# Patient Record
Sex: Female | Born: 1970
Health system: Southern US, Community
[De-identification: ages and names within clinical notes are randomized; demographics above are authoritative.]

## PROBLEM LIST (undated history)

## (undated) DIAGNOSIS — I10 Essential (primary) hypertension: Secondary | ICD-10-CM

## (undated) DIAGNOSIS — F419 Anxiety disorder, unspecified: Secondary | ICD-10-CM

## (undated) DIAGNOSIS — E079 Disorder of thyroid, unspecified: Secondary | ICD-10-CM

## (undated) DIAGNOSIS — B009 Herpesviral infection, unspecified: Secondary | ICD-10-CM

## (undated) HISTORY — DX: Anxiety disorder, unspecified: F41.9

## (undated) HISTORY — DX: Essential (primary) hypertension: I10

## (undated) HISTORY — DX: Disorder of thyroid, unspecified: E07.9

## (undated) HISTORY — DX: Herpesviral infection, unspecified: B00.9

---

## 1994-12-29 HISTORY — PX: TUBAL LIGATION: SHX77

## 1999-11-14 ENCOUNTER — Other Ambulatory Visit: Admission: RE | Admit: 1999-11-14 | Discharge: 1999-11-14 | Payer: Self-pay | Admitting: Obstetrics and Gynecology

## 2000-12-10 ENCOUNTER — Other Ambulatory Visit: Admission: RE | Admit: 2000-12-10 | Discharge: 2000-12-10 | Payer: Self-pay | Admitting: Obstetrics and Gynecology

## 2000-12-24 ENCOUNTER — Encounter: Payer: Self-pay | Admitting: Emergency Medicine

## 2000-12-24 ENCOUNTER — Observation Stay (HOSPITAL_COMMUNITY): Admission: EM | Admit: 2000-12-24 | Discharge: 2000-12-25 | Payer: Self-pay | Admitting: Emergency Medicine

## 2002-04-21 ENCOUNTER — Other Ambulatory Visit: Admission: RE | Admit: 2002-04-21 | Discharge: 2002-04-21 | Payer: Self-pay | Admitting: Obstetrics and Gynecology

## 2003-05-22 ENCOUNTER — Other Ambulatory Visit: Admission: RE | Admit: 2003-05-22 | Discharge: 2003-05-22 | Payer: Self-pay | Admitting: Obstetrics and Gynecology

## 2004-08-26 ENCOUNTER — Other Ambulatory Visit: Admission: RE | Admit: 2004-08-26 | Discharge: 2004-08-26 | Payer: Self-pay | Admitting: Obstetrics and Gynecology

## 2004-09-28 HISTORY — PX: BARTHOLIN GLAND CYST EXCISION: SHX565

## 2004-11-15 ENCOUNTER — Ambulatory Visit (HOSPITAL_COMMUNITY): Admission: RE | Admit: 2004-11-15 | Discharge: 2004-11-15 | Payer: Self-pay | Admitting: Obstetrics and Gynecology

## 2004-11-15 ENCOUNTER — Encounter (INDEPENDENT_AMBULATORY_CARE_PROVIDER_SITE_OTHER): Payer: Self-pay | Admitting: *Deleted

## 2004-11-19 ENCOUNTER — Observation Stay (HOSPITAL_COMMUNITY): Admission: RE | Admit: 2004-11-19 | Discharge: 2004-11-20 | Payer: Self-pay | Admitting: Obstetrics and Gynecology

## 2006-03-27 ENCOUNTER — Emergency Department (HOSPITAL_COMMUNITY): Admission: EM | Admit: 2006-03-27 | Discharge: 2006-03-27 | Payer: Self-pay | Admitting: Emergency Medicine

## 2006-06-18 ENCOUNTER — Other Ambulatory Visit: Admission: RE | Admit: 2006-06-18 | Discharge: 2006-06-18 | Payer: Self-pay | Admitting: Obstetrics & Gynecology

## 2007-06-07 ENCOUNTER — Emergency Department (HOSPITAL_COMMUNITY): Admission: EM | Admit: 2007-06-07 | Discharge: 2007-06-08 | Payer: Self-pay | Admitting: Emergency Medicine

## 2007-06-25 ENCOUNTER — Other Ambulatory Visit: Admission: RE | Admit: 2007-06-25 | Discharge: 2007-06-25 | Payer: Self-pay | Admitting: Obstetrics & Gynecology

## 2008-11-02 ENCOUNTER — Other Ambulatory Visit: Admission: RE | Admit: 2008-11-02 | Discharge: 2008-11-02 | Payer: Self-pay | Admitting: Obstetrics and Gynecology

## 2011-05-16 NOTE — Op Note (Signed)
NAMECARMINE, YOUNGBERG               ACCOUNT NO.:  [id number]   MEDICAL RECORD NO.:  [id number]          PATIENT TYPE:  AMB   LOCATION:  SDC                           FACILITY:  [facility]   PHYSICIAN:  Dineen Kid. Rana Snare, M.D.    DATE OF BIRTH:  11/23/1971   DATE OF PROCEDURE:  11/15/2004  DATE OF DISCHARGE:                                 OPERATIVE REPORT   PREOPERATIVE DIAGNOSES:  Recurrent right Bartholin's cyst.   POSTOPERATIVE DIAGNOSES:  Recurrent right Bartholin's cyst.   PROCEDURE:  Excision of right Bartholin's duct cyst.   SURGEON:  Dineen Kid. Rana Snare, M.D.   INDICATIONS FOR PROCEDURE:  Ms. Donna Ray is a 40 year old, G3, P2, A1 whose  had problems with recurrent right Bartholin's duct cyst. She continues to  have a problem with this and has been scheduled several times in the past to  have this excised but never truly has found the time to get this done.  She  has had it marsupialized in the past with recurrences of this and she  presents today for definitive surgical excision of the Bartholin's duct  cyst.  The risks and benefits were discussed at length which include but not  limited to risk of infection, bleeding, damage to the vulva, bleeding  problems, risks associated with bleeding, nerve damage or chronic pain. She  gives her informed consent.   DESCRIPTION OF PROCEDURE:  After adequate analgesic, the patient placed in  the dorsal lithotomy position, she was sterilely prepped and draped, bladder  was sterilely drained. An elliptical incision was made just outside the  hymenal ring across the right Bartholin's duct cyst which was measured and  approximately 3-4 cm in size.  Approximately a 2.5 cm incision was made  which was taken down sharply to the Bartholin's duct cyst wall. The cyst  wall was grasped with the Allis clamp and was dissected free from the wall  using Metzenbaum scissors. The base was incised with Bovie cautery and the  duct removed. There was some leakage at the  time which was clear mucusy  fluid. In the base of the gland, the blood supply was grasped with  hemostats, suture ligated with #0 Monocryl suture in a figure-of-eight  fashion until the hemostasis was achieved.  At the base of the gland, the  dead space was closed with interrupted sutures of #0 Monocryl suture  achieving good hemostasis as well. The skin was then closed with a running 3-  0 Monocryl in a running fashion with good approximation and good hemostasis  achieved. The incision was then injected with Marcaine 0.25%, 10 mL used.  The patient went to recovery in stable condition. The patient received 1 g  of Rocephin preoperatively.  Estimated blood loss 100 mL.   DISPOSITION:  The patient will be discharged home with followup in the  office in 2-3 weeks. Sent home with a prescription for Vicodin #30, Keflex  50 mg t.i.d. for seven days and refilled her prescription for Prozac 20 mg  q.d.  Told to continue with ice for 24 hours, call for increased pain, fever  or bleeding.     DCL/MEDQ  D:  11/15/2004  T:  11/15/2004  Job:  [id number]

## 2011-05-16 NOTE — Op Note (Signed)
NAMEAREONA, Donna Ray               ACCOUNT NO.:  [id number]   MEDICAL RECORD NO.:  [id number]          PATIENT TYPE:  INP   LOCATION:  9303                          FACILITY:  [facility]   PHYSICIAN:  Michelle L. Grewal, M.D.DATE OF BIRTH:  06/30/1971   DATE OF PROCEDURE:  11/19/2004  DATE OF DISCHARGE:                                 OPERATIVE REPORT   PREOPERATIVE DIAGNOSIS:  Right labia hematoma.   POSTOPERATIVE DIAGNOSIS:  Right labia hematoma.   PROCEDURE:  Evaluation of right labial hematoma.   SURGEON:  Dr. Vincente Poli   ANESTHESIA:  MAC.   ESTIMATED BLOOD LOSS:  600 mL.   DESCRIPTION OF PROCEDURE:  The patient was taken to the operating room.  She  was given sedation, placed in the lithotomy position.  Exam revealed an 8 cm  right labial hematoma.  She was prepped and draped in the usual sterile  fashion.  Local was infiltrated just over the hematoma, and an approximately  3 cm incision was made over the hematoma just lateral to a previous surgical  incision.  We immediately noticed bright red arterial bleeding which was  pulsatile.  We then had to extend the incision slightly inferiorly, and I  was able to identify the bleeding was coming from the inferior portion of  the surgical bed.  Using a series of interrupteds that had to be placed  pretty deeply using 0 Vicryl suture, we performed probably 6-7 figure-of-  eights prior to getting hemostasis.  There was still some oozing from the  superior part of the incision down from the right labia, and packing was  placed in this area for hemostasis.  At the end of the procedure, there was  no active bleeding noted, and the hematoma was completely decompressed.  I  did leave the incision open so that they could close by secondary intent.  An ice pack was placed in the perineum.  All sponge, lap, and instrument  counts were correct x 2.  The patient was taken to the recovery room in  stable condition.     [place]   MLG/MEDQ  D:   11/19/2004  T:  11/19/2004  Job:  [id number]

## 2011-05-16 NOTE — Discharge Summary (Signed)
[facility]  Patient:    Donna Ray, Donna Ray                      MRN: [id number] Adm. Date:  [id number] Disc. Date: [id number] Attending:  Selina Cooley Dictator:   Selina Cooley, M.D.                           Discharge Summary  DISCHARGE DIAGNOSES: 1. Nausea, vomiting, diarrhea, probable gastroenteritis. 2. Dehydration. 3. Mild transaminitis with SGOT 49, SGPT 45. 4. Bacterial vaginosis. 5. Status post cesarean section x 2 in 1993 and 1996.  DISCHARGE MEDICATIONS: MetroGel as previously prescribed.  CONDITION UPON DISCHARGE: The patient is afebrile with stable vital signs, alert and oriented, tolerating liquids with no nausea or vomiting, complete resolution of her abdominal pain and no further recurrence of diarrhea.  REASON FOR ADMISSION:  The patient is a 40 year old African-American female, otherwise healthy, who describes acute onset of nausea and vomiting the morning prior to admission associated with diarrhea.  Nonbloody emesis or stool was noted.  She was seen by her primary care physician and administered Phenergan 25 IM in the office; however, despite the antiemetics, she continued to have multiple episodes of emesis associated with periumbilical pain.  HOSPITAL COURSE:  #1 - NAUSEA, VOMITING, DIARRHEA:  Probable viral gastroenteritis.  The patient was admitted for IV fluids and was administered IV antiemetics and pain medicine.  She did not require any further medications after her initial admission doses.  She described marked resolution of all of her admitting symptoms.  CMET was obtained with normal chemistries; however there were slightly elevated transaminase in the 40s approximately two times normal.  Her white count was slightly elevated at 12.5 after hydration.  The following morning, it was noted to be 10.8.  Given the marked improvement in her symptoms, no further workup was deemed necessary, and she will be discharged to  home as she is tolerating a p.o. diet.  #2 - BACTERIAL VAGINOSIS:  Diagnosed prior to admission.  The patient will continue her medication for treatment of this.  The patient did not have any history or evidence suggestive of PID, and suspicion was further diminished since her symptoms resolved so quickly after IV fluids.  The patient will be discharged to home and follow up with Dr. Kevan Ny as needed. DD:  12/25/00 TD:  12/25/00 Job: [id number]

## 2011-10-16 LAB — URINE MICROSCOPIC-ADD ON

## 2011-10-16 LAB — BASIC METABOLIC PANEL
BUN: 4 — ABNORMAL LOW
CO2: 22
Calcium: 9.7
Chloride: 107
Creatinine, Ser: 0.81
GFR calc Af Amer: >60
GFR calc non Af Amer: >60
Glucose, Bld: 138 — ABNORMAL HIGH
Potassium: 3.2 — ABNORMAL LOW
Sodium: 142

## 2011-10-16 LAB — URINALYSIS, ROUTINE W REFLEX MICROSCOPIC
Bilirubin Urine: NEGATIVE
Glucose, UA: NEGATIVE
Hgb urine dipstick: NEGATIVE
Ketones, ur: >80 — AB
Leukocytes, UA: NEGATIVE
Nitrite: NEGATIVE
Protein, ur: 30 — AB
Specific Gravity, Urine: 1.021
Urobilinogen, UA: 0.2
pH: 7

## 2011-10-16 LAB — PREGNANCY, URINE: Preg Test, Ur: NEGATIVE

## 2013-05-27 ENCOUNTER — Encounter: Payer: Self-pay | Admitting: *Deleted

## 2013-05-30 ENCOUNTER — Encounter: Payer: Self-pay | Admitting: *Deleted

## 2013-05-31 ENCOUNTER — Ambulatory Visit: Payer: Self-pay | Admitting: Obstetrics & Gynecology

## 2013-06-06 ENCOUNTER — Encounter: Payer: Self-pay | Admitting: *Deleted

## 2013-06-09 ENCOUNTER — Ambulatory Visit: Payer: Self-pay | Admitting: Obstetrics & Gynecology

## 2013-07-22 ENCOUNTER — Telehealth: Payer: Self-pay | Admitting: Obstetrics & Gynecology

## 2013-07-22 MED ORDER — ALPRAZOLAM 0.25 MG PO TABS: 0.2500 mg | ORAL_TABLET | Freq: Every evening | ORAL | Status: DC | PRN

## 2013-07-22 NOTE — Telephone Encounter (Signed)
Refill request for ALPRAZOLAM Last filled by MD on - 05/10/12, #30 X 5 rf (take QHS) Last AEX - 05/10/12 Next AEX - 07/28/13 Please advise if refill OK.

## 2013-07-22 NOTE — Telephone Encounter (Signed)
Chart on your desk.

## 2013-07-22 NOTE — Telephone Encounter (Signed)
Patient needs anxiety rx (patient forgets name) refill sent to [facility] on [address]. AEX appointment moved up with Dr. Hyacinth Meeker from mid-August to 07/28/13 with Dr. Gloris Ham.

## 2013-07-28 ENCOUNTER — Encounter: Payer: Self-pay | Admitting: Obstetrics & Gynecology

## 2013-07-28 ENCOUNTER — Ambulatory Visit (INDEPENDENT_AMBULATORY_CARE_PROVIDER_SITE_OTHER): Payer: Managed Care, Other (non HMO) | Admitting: Obstetrics & Gynecology

## 2013-07-28 VITALS — BP 140/90 | HR 68 | Resp 16 | Ht 65.75 in | Wt 132.6 lb

## 2013-07-28 DIAGNOSIS — Z0000 Encounter for general adult medical examination without abnormal findings: Secondary | ICD-10-CM

## 2013-07-28 DIAGNOSIS — Z01419 Encounter for gynecological examination (general) (routine) without abnormal findings: Secondary | ICD-10-CM

## 2013-07-28 LAB — POCT URINALYSIS DIPSTICK
Ketones, UA: NEGATIVE
Protein, UA: NEGATIVE

## 2013-07-28 NOTE — Progress Notes (Signed)
42 y.o. G3P2 Legally SeparatedAfrican AmericanF here for annual exam.  Patient is doing well.  54 year old son had ruptured appendix and ended up in hospital for 21 days.  He was able to graduated on time.  Very scary for patient.   He's going to [employer].  He's going to be a Runner, broadcasting/film/video.  Cycles regular, every 26-28 days.  No change.    Patient's last menstrual period was 07/24/2013.          Sexually active: yes  The current method of family planning is tubal ligation.    Exercising: yes  walking and toning Smoker:  no  Health Maintenance: Pap:  05/10/12 WNL/negative HR HPV History of abnormal Pap:  no MMG:  2012 normal Colonoscopy:  none BMD:   none TDaP:  05/10/12 Screening Labs: 1/12, Urine today: PH-8, RBC-1+   reports that she has never smoked. She has never used smokeless tobacco. She reports that she drinks about 0.5 ounces of alcohol per week. She reports that she does not use illicit drugs.  Past Medical History  Diagnosis Date  . Anxiety   . HSV-2 infection   . Hypertension     Past Surgical History  Procedure Laterality Date  . Bartholin gland cyst excision      with hematoma post op  . Cesarean section      x2  . Tubal ligation      Current Outpatient Prescriptions  Medication Sig Dispense Refill  . ALPRAZolam (XANAX) 0.25 MG tablet Take 1 tablet (0.25 mg total) by mouth at bedtime as needed for sleep.  30 tablet  0  . AMLODIPINE BESYLATE PO Take 2.5 mg by mouth daily.      Marland Kitchen ibuprofen (ADVIL,MOTRIN) 200 MG tablet Take 200 mg by mouth every 6 (six) hours as needed for pain.      . Multiple Vitamin (MULTIVITAMIN) capsule Take 1 capsule by mouth daily.      Marland Kitchen zolpidem (AMBIEN) 10 MG tablet Take 10 mg by mouth at bedtime as needed for sleep.       No current facility-administered medications for this visit.    Family History  Problem Relation Age of Onset  . Hyperlipidemia Mother   . Hypertension Mother   . Diabetes Father     ROS:  Pertinent items are noted in  HPI.  Otherwise, a comprehensive ROS was negative.  Exam:   BP 140/90  Pulse 68  Resp 16  Ht 5' 5.75" (1.67 m)  Wt 132 lb 9.6 oz (60.147 kg)  BMI 21.57 kg/m2  LMP 07/24/2013  Weight change: +2lb  Height: 5' 5.75" (167 cm)  Ht Readings from Last 3 Encounters:  07/28/13 5' 5.75" (1.67 m)    General appearance: alert, cooperative and appears stated age Head: Normocephalic, without obvious abnormality, atraumatic Neck: no adenopathy, supple, symmetrical, trachea midline and thyroid normal to inspection and palpation Lungs: clear to auscultation bilaterally Breasts: normal appearance, no masses or tenderness Heart: regular rate and rhythm Abdomen: soft, non-tender; bowel sounds normal; no masses,  no organomegaly Extremities: extremities normal, atraumatic, no cyanosis or edema Skin: Skin color, texture, turgor normal. No rashes or lesions Lymph nodes: Cervical, supraclavicular, and axillary nodes normal. No abnormal inguinal nodes palpated Neurologic: Grossly normal   Pelvic: External genitalia:  no lesions              Urethra:  normal appearing urethra with no masses, tenderness or lesions  Bartholins and Skenes: normal                 Vagina: normal appearing vagina with normal color and discharge, no lesions              Cervix: no lesions              Pap taken: no Bimanual Exam:  Uterus:  normal size, contour, position, consistency, mobility, non-tender              Adnexa: normal adnexa and no mass, fullness, tenderness               Rectovaginal: Confirms               Anus:  normal sphincter tone, no lesions  A:  Well Woman with normal exam Hypertension Anxiety H/O BTL  P:   Mammogram over due.  Appt scheduled for pt. pap smear with neg HR HPV last year.  No Pap today. return annually or prn  An After Visit Summary was printed and given to the patient.

## 2013-07-28 NOTE — Patient Instructions (Signed)

## 2013-08-05 ENCOUNTER — Telehealth: Payer: Self-pay | Admitting: Obstetrics & Gynecology

## 2013-08-05 MED ORDER — ZOLPIDEM TARTRATE 10 MG PO TABS: 10.0000 mg | ORAL_TABLET | Freq: Every evening | ORAL | Status: DC | PRN

## 2013-08-05 NOTE — Addendum Note (Signed)
Addended by: Jerene Bears on: 08/05/2013 04:45 PM   Modules accepted: Orders

## 2013-08-05 NOTE — Telephone Encounter (Signed)
Needs refill for zolpidem 10 mg called in to [facility].  She is out of medication.

## 2013-08-05 NOTE — Telephone Encounter (Signed)
Please advise patient is needing rf of Ambien, pt was last seen for AEX 07/28/13 no rx was given.

## 2013-08-05 NOTE — Telephone Encounter (Signed)
#  30/5 refills was sent By Dr.Miller

## 2013-08-17 ENCOUNTER — Other Ambulatory Visit: Payer: Self-pay | Admitting: Obstetrics & Gynecology

## 2013-08-17 NOTE — Telephone Encounter (Signed)
Please advise Last Refilled given 07/22/13 #30/0rfs Last AEX 07/28/13

## 2013-08-19 ENCOUNTER — Ambulatory Visit: Payer: Self-pay | Admitting: Obstetrics & Gynecology

## 2013-08-22 ENCOUNTER — Telehealth: Payer: Self-pay | Admitting: Obstetrics & Gynecology

## 2013-08-22 NOTE — Telephone Encounter (Signed)
Patient needs refill on Gen. Xanax . Completely out of it as of today.

## 2013-08-22 NOTE — Telephone Encounter (Signed)
Rx was sent 08/17/13 #30/0rfs was sent to pharmacy pt notified.

## 2013-09-20 ENCOUNTER — Telehealth: Payer: Self-pay | Admitting: Obstetrics & Gynecology

## 2013-09-20 NOTE — Telephone Encounter (Signed)
Spoke with pt about symptoms. Pt had nausea and vomiting with some diarrhea last week and saw her PCP. Pt thinks her rectum is irritated from all the wiping she's had to do. Only had a tiny amount of bleeding on the toilet tissue. Denies any trauma to area. Offered OV to pt, but she declined for now. Advised pt to try Aveeno oatmeal sitz bath in shallow warm water. Pt agreeable and will call back if necessary for OV or if symptoms do not resolve. Any further advice?

## 2013-09-20 NOTE — Telephone Encounter (Signed)
Patient is having discomfort around rectum area. Some bleeding when she wipes. Stool is loose and sticky. Has been going on since Saturday.

## 2013-09-22 ENCOUNTER — Other Ambulatory Visit: Payer: Self-pay | Admitting: Obstetrics & Gynecology

## 2013-09-22 NOTE — Telephone Encounter (Signed)
08/17/13 #30/0rf;s was sent  AEX was 07/28/13  Please advise.

## 2013-09-23 NOTE — Telephone Encounter (Signed)
Pt takes qhs.  RX for six months done.

## 2013-09-27 NOTE — Telephone Encounter (Signed)
Patient reports she is still having trouble with her rectum and wants an appointment please?

## 2013-09-27 NOTE — Telephone Encounter (Signed)
Spoke with patient. She was unable to make the 1045 OV offered today with Dr. Hyacinth Meeker.  She requests to be seen by Verner Chol CNM.  Appointment scheduled for tomorrow per patient request.

## 2013-09-28 ENCOUNTER — Encounter: Payer: Self-pay | Admitting: Certified Nurse Midwife

## 2013-09-28 ENCOUNTER — Ambulatory Visit (INDEPENDENT_AMBULATORY_CARE_PROVIDER_SITE_OTHER): Payer: Managed Care, Other (non HMO) | Admitting: Certified Nurse Midwife

## 2013-09-28 VITALS — BP 90/60 | HR 78 | Resp 16 | Ht 65.75 in | Wt 117.0 lb

## 2013-09-28 DIAGNOSIS — K5289 Other specified noninfective gastroenteritis and colitis: Secondary | ICD-10-CM

## 2013-09-28 NOTE — Progress Notes (Signed)
42 y.o. Divorced [place] American female G3P2 here with complaint of loose stools and then formed stools for the past two weeks. Denies bloody stools or dark tarry stools. Patient saw PCP regarding this two weeks ago with labs. All normal except for anemia, has not started in OTC iron supplement. per patient( will send copy of report.) Patient eating sporadically. Drinking water through out the day.Patient denies fever, chills, headache or vomiting. No exposure to flu like illness that she is aware of. Has not traveled out of the country.Patient experiencing new company take over at work and job security is at risk. 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 Patient was started on Citalopram approximately 1 1/2 weeks per PCP to help with anxiety,she feels is helping some. Periods normal, no issues. No STD concerns.  O: Healthy WD,thin female Affect: Anxious, Orientation x 3 Skin:Warm and dry Abdomen:Soft, non tender, no masses palpated, bowel sounds noted in all 4 quadrants, normal, no point of any pain noted Pelvic exam:EXTERNAL GENITALIA: normal appearing vulva with no masses, tenderness or lesions VAGINA: no abnormal discharge or lesions CERVIX: no lesions or cervical motion tenderness and non tender UTERUS: anteverted and normal,non tender ADNEXA: no masses palpable and nontender RECTUM: no masses or abnormalities, no blood noted  A:Gastrenteritis vs IBS related to anxiety level with work and family stress. Normal abdominal exam Normal pelvic exam   P: Discussed findings of normal exam. Discussed stress and anxiety effect on GI system. Discussed that normal diet with fiber will help with regulating the bowel. Instructed to start on Adult refrigerated Probiotics two per day for one week then daily. Given information on normal diet and fiber content handout. Warning signs of IBS and loose stools given. If bloody stools occur will need GI referral.  Stressed asking for help with mother from other siblings until job stress decreases. Patient will try. Given note for absence of work. Patient to fax labs from PCP. Encouraged to start OTC time release iron daily as directed. Recheck 1 week if has not resolved or needs referral.   RV as above or prn

## 2013-09-29 NOTE — Progress Notes (Signed)
Note reviewed, agree with plan.  Abbie Jablon, MD  

## 2013-10-05 ENCOUNTER — Encounter (HOSPITAL_COMMUNITY): Payer: Self-pay | Admitting: Emergency Medicine

## 2013-10-05 ENCOUNTER — Emergency Department (HOSPITAL_COMMUNITY)
Admission: EM | Admit: 2013-10-05 | Discharge: 2013-10-05 | Disposition: A | Discharge status: Home / Self Care | Payer: BC Managed Care – PPO | Attending: Emergency Medicine | Admitting: Emergency Medicine

## 2013-10-05 DIAGNOSIS — I10 Essential (primary) hypertension: Secondary | ICD-10-CM | POA: Insufficient documentation

## 2013-10-05 DIAGNOSIS — Z79899 Other long term (current) drug therapy: Secondary | ICD-10-CM | POA: Insufficient documentation

## 2013-10-05 DIAGNOSIS — Z8619 Personal history of other infectious and parasitic diseases: Secondary | ICD-10-CM | POA: Insufficient documentation

## 2013-10-05 DIAGNOSIS — R1033 Periumbilical pain: Secondary | ICD-10-CM | POA: Insufficient documentation

## 2013-10-05 DIAGNOSIS — R209 Unspecified disturbances of skin sensation: Secondary | ICD-10-CM | POA: Insufficient documentation

## 2013-10-05 DIAGNOSIS — R112 Nausea with vomiting, unspecified: Secondary | ICD-10-CM | POA: Insufficient documentation

## 2013-10-05 DIAGNOSIS — F411 Generalized anxiety disorder: Secondary | ICD-10-CM | POA: Insufficient documentation

## 2013-10-05 LAB — URINALYSIS, ROUTINE W REFLEX MICROSCOPIC
Bilirubin Urine: NEGATIVE
Hgb urine dipstick: NEGATIVE
Ketones, ur: >80 mg/dL — AB
Protein, ur: NEGATIVE mg/dL
Specific Gravity, Urine: 1.012 (ref 1.005–1.030)
Urobilinogen, UA: 0.2 mg/dL (ref 0.0–1.0)

## 2013-10-05 LAB — COMPREHENSIVE METABOLIC PANEL
ALT: 63 U/L — ABNORMAL HIGH (ref 0–35)
BUN: 8 mg/dL (ref 6–23)
CO2: 18 mEq/L — ABNORMAL LOW (ref 19–32)
Calcium: 10.2 mg/dL (ref 8.4–10.5)
GFR calc Af Amer: >90 mL/min (ref >90)
GFR calc non Af Amer: >90 mL/min (ref >90)
Glucose, Bld: 166 mg/dL — ABNORMAL HIGH (ref 70–99)
Sodium: 139 mEq/L (ref 135–145)
Total Bilirubin: 0.4 mg/dL (ref 0.3–1.2)
Total Protein: 7.1 g/dL (ref 6.0–8.3)

## 2013-10-05 LAB — CBC WITH DIFFERENTIAL/PLATELET
Eosinophils Relative: 0 % (ref 0–5)
HCT: 36.5 % (ref 36.0–46.0)
Hemoglobin: 12.1 g/dL (ref 12.0–15.0)
Lymphocytes Relative: 25 % (ref 12–46)
Lymphs Abs: 1.4 10*3/uL (ref 0.7–4.0)
MCV: 72.3 fL — ABNORMAL LOW (ref 78.0–100.0)
Monocytes Absolute: 0.5 10*3/uL (ref 0.1–1.0)
Neutro Abs: 3.7 10*3/uL (ref 1.7–7.7)
RBC: 5.05 MIL/uL (ref 3.87–5.11)
WBC: 5.7 10*3/uL (ref 4.0–10.5)

## 2013-10-05 LAB — POCT I-STAT, CHEM 8
BUN: 4 mg/dL — ABNORMAL LOW (ref 6–23)
Chloride: 112 mEq/L (ref 96–112)
Creatinine, Ser: 0.6 mg/dL (ref 0.50–1.10)
HCT: 42 % (ref 36.0–46.0)
Hemoglobin: 14.3 g/dL (ref 12.0–15.0)
Potassium: 3.9 mEq/L (ref 3.5–5.1)
Sodium: 143 mEq/L (ref 135–145)

## 2013-10-05 LAB — SALICYLATE LEVEL: Salicylate Lvl: <2 mg/dL — ABNORMAL LOW (ref 2.8–20.0)

## 2013-10-05 LAB — CG4 I-STAT (LACTIC ACID): Lactic Acid, Venous: 2.22 mmol/L — ABNORMAL HIGH (ref 0.5–2.2)

## 2013-10-05 LAB — LIPASE, BLOOD: Lipase: 25 U/L (ref 11–59)

## 2013-10-05 MED ORDER — METOCLOPRAMIDE HCL 5 MG/ML IJ SOLN
10.0000 mg | INTRAMUSCULAR | Status: AC
  Administered 2013-10-05: 10 mg via INTRAVENOUS
  Filled 2013-10-05: qty 2

## 2013-10-05 MED ORDER — SODIUM CHLORIDE 0.9 % IV BOLUS (SEPSIS): 1000.0000 mL | Freq: Once | INTRAVENOUS | Status: DC

## 2013-10-05 MED ORDER — ONDANSETRON HCL 4 MG/2ML IJ SOLN
4.0000 mg | INTRAMUSCULAR | Status: AC
  Administered 2013-10-05: 4 mg via INTRAVENOUS
  Filled 2013-10-05: qty 2

## 2013-10-05 MED ORDER — SODIUM CHLORIDE 0.9 % IV BOLUS (SEPSIS)
1000.0000 mL | Freq: Once | INTRAVENOUS | Status: AC
  Administered 2013-10-05: 1000 mL via INTRAVENOUS

## 2013-10-05 MED ORDER — OMEPRAZOLE 20 MG PO CPDR: 20.0000 mg | DELAYED_RELEASE_CAPSULE | Freq: Every day | ORAL | Status: DC

## 2013-10-05 MED ORDER — MORPHINE SULFATE 4 MG/ML IJ SOLN
4.0000 mg | Freq: Once | INTRAMUSCULAR | Status: AC
  Administered 2013-10-05: 4 mg via INTRAVENOUS
  Filled 2013-10-05: qty 1

## 2013-10-05 MED ORDER — ONDANSETRON HCL 4 MG PO TABS: 4.0000 mg | ORAL_TABLET | Freq: Four times a day (QID) | ORAL | Status: DC

## 2013-10-05 MED ORDER — PROMETHAZINE HCL 25 MG/ML IJ SOLN
12.5000 mg | Freq: Once | INTRAMUSCULAR | Status: AC
  Administered 2013-10-05: 12.5 mg via INTRAVENOUS
  Filled 2013-10-05: qty 1

## 2013-10-05 MED ORDER — HYDROMORPHONE HCL PF 1 MG/ML IJ SOLN
1.0000 mg | Freq: Once | INTRAMUSCULAR | Status: AC
  Administered 2013-10-05: 1 mg via INTRAVENOUS
  Filled 2013-10-05: qty 1

## 2013-10-05 NOTE — ED Notes (Signed)
IV team paged for IV start after several unsuccessful attempts.

## 2013-10-05 NOTE — ED Notes (Signed)
Patient states she started having N/V this morning about 630a.m.   Patient states her tongue and extremities are "tingly". Patient states abdominal pain. Patient denies any other symptoms.

## 2013-10-05 NOTE — ED Notes (Signed)
Pt is vomiting again, reports 2/10 abdominal pain.

## 2013-10-05 NOTE — ED Provider Notes (Signed)
CSN: [id number]     Arrival date & time 10/05/13  0820 History   First MD Initiated Contact with Patient 10/05/13 [phone]     Chief Complaint  Patient presents with  . Nausea  . Emesis   (Consider location/radiation/quality/duration/timing/severity/associated sxs/prior Treatment) HPI Comments: 42 y/o female with a hx of HTN presents for nausea and emesis with onset this morning. Patient endorses TNTC episodes of nonbloody, nonbilious emesis; she is actively dry heaving in exam room bed. Patient denies any modifying factors of her symptoms, but states they are associated with a periumbilical pain that she describes as a "fluttering" as well as a tingling sensation in her b/l hands. She endorses a bowel movement PTA that was normal and non-bloody. She denies associated fever, CP, SOB, urinary symptoms, melena, hematochezia, vaginal complaints, and weakness. Abdominal surgical hx significant for C-sections x2. Patient endorses a history of "bowel problems", but has only seen her OBGYN and PCP for these complaints. No hx of endoscopy or colonoscopy.  Patient is a 42 y.o. female presenting with vomiting. The history is provided by the patient. No language interpreter was used.  Emesis Associated symptoms: abdominal pain   Associated symptoms: no diarrhea     Past Medical History  Diagnosis Date  . Anxiety   . HSV-2 infection   . Hypertension    Past Surgical History  Procedure Laterality Date  . Bartholin gland cyst excision  10/05  . Cesarean section  1993, 1996    x2  . Tubal ligation  1996   Family History  Problem Relation Age of Onset  . Hyperlipidemia Mother   . Hypertension Mother   . Diabetes Father    History  Substance Use Topics  . Smoking status: Never Smoker   . Smokeless tobacco: Never Used  . Alcohol Use: 0.5 oz/week    1 drink(s) per week   OB History   Grav Para Term Preterm Abortions TAB SAB Ect Mult Living   3 2        2      Review of Systems   Constitutional: Negative for fever.  Respiratory: Negative for shortness of breath.   Cardiovascular: Negative for chest pain.  Gastrointestinal: Positive for nausea, vomiting and abdominal pain. Negative for diarrhea, constipation and blood in stool.  Genitourinary: Negative for dysuria, hematuria, vaginal bleeding and vaginal discharge.  Neurological: Negative for weakness and numbness.       +tingling in b/l hands  All other systems reviewed and are negative.    Allergies  Review of patient's allergies indicates no known allergies.  Home Medications   Current Outpatient Rx  Name  Route  Sig  Dispense  Refill  . ALPRAZolam (XANAX) 0.25 MG tablet   Oral   Take 0.25 mg by mouth at bedtime as needed for sleep.         Marland Kitchen amLODipine (NORVASC) 2.5 MG tablet   Oral   Take 2.5 mg by mouth daily.         . citalopram (CELEXA) 20 MG tablet      daily.         Marland Kitchen ibuprofen (ADVIL,MOTRIN) 200 MG tablet   Oral   Take 200 mg by mouth every 6 (six) hours as needed for pain.         . Multiple Vitamin (MULTIVITAMIN) capsule   Oral   Take 1 capsule by mouth daily.         Marland Kitchen omeprazole (PRILOSEC) 20 MG capsule   Oral  Take 1 capsule (20 mg total) by mouth daily.   30 capsule   0   . ondansetron (ZOFRAN) 4 MG tablet   Oral   Take 1 tablet (4 mg total) by mouth every 6 (six) hours.   20 tablet   0    BP 164/83  Pulse 92  Temp(Src) 97 F (36.1 C) (Oral)  Resp 18  SpO2 100%  LMP 09/17/2013  Physical Exam  Nursing note and vitals reviewed. Constitutional: She is oriented to person, place, and time. She appears well-developed and well-nourished. No distress.  Patient actively dry heaving in exam room bed. In no acute distress; appears uncomfortable.  HENT:  Head: Normocephalic and atraumatic.  Eyes: Conjunctivae and EOM are normal. No scleral icterus.  Neck: Normal range of motion. Neck supple.  Cardiovascular: Normal rate, regular rhythm, normal heart sounds  and intact distal pulses.   Pulmonary/Chest: Effort normal and breath sounds normal. No respiratory distress. She has no wheezes. She has no rales.  Abdominal: Soft. She exhibits no distension and no mass. There is tenderness (diffuse, nonfocal). There is no rebound and no guarding.  No peritoneal signs or guarding  Musculoskeletal: Normal range of motion.  Neurological: She is alert and oriented to person, place, and time.  Skin: Skin is warm and dry. No rash noted. She is not diaphoretic. No erythema. No pallor.  Psychiatric: She has a normal mood and affect. Her behavior is normal.    ED Course  Procedures (including critical care time) Labs Review Labs Reviewed  CBC WITH DIFFERENTIAL - Abnormal; Notable for the following:    MCV 72.3 (*)    MCH 24.0 (*)    All other components within normal limits  COMPREHENSIVE METABOLIC PANEL - Abnormal; Notable for the following:    CO2 18 (*)    Glucose, Bld 166 (*)    Creatinine, Ser 0.47 (*)    AST 55 (*)    ALT 63 (*)    All other components within normal limits  URINALYSIS, ROUTINE W REFLEX MICROSCOPIC - Abnormal; Notable for the following:    Ketones, ur >80 (*)    All other components within normal limits  SALICYLATE LEVEL - Abnormal; Notable for the following:    Salicylate Lvl <2.0 (*)    All other components within normal limits  CG4 I-STAT (LACTIC ACID) - Abnormal; Notable for the following:    Lactic Acid, Venous 5.01 (*)    All other components within normal limits  LIPASE, BLOOD  POCT PREGNANCY, URINE   Imaging Review No results found.  MDM   1. Nausea and vomiting in adult    9:15 - Patient presents for abdominal pain with N/V onset this AM. Patient endorses having similar episodes over the past month and has seen her PCP and OBGYN for these complaints. Patient uncomfortable, but nontoxic, appearing, hemodynamically stable, and afebrile on arrival. Physical exam findings as above. No focal TTP or peritoneal signs  appreciated on physical exam. Will obtain CBC, CMP, lipase, UA, and Upreg for further work up. IVFand zofran ordered for symptoms.  9:58 - Patient with anion gap of 20. No imbalance of K, Cl, or Na. Lactic acid, VBG, and salicylate level ordered for further evaluation of anion gap. Possible that elevated gap secondary to TNTC emesis. Patient denies taking tylenol for her pain.  10:15 - Lactic acid elevated to 5.01. Second IVF bolus ordered. Morphine ordered for pain control. Will continue to monitor.  3:00 - Reexamination of the abdomen  with improved TTP. Patient endorses improvement in her abdominal pain as well. Lactic acid improved to 2.22 and anion gap improved to 10 after 3L IVF. Do not believe further work up with imaging is indicated at this time. Patient continues to have sporadic dry heaves, but has been without outright emesis. Have spoken with this patient about managing her symptoms further at home to which she is agreeable to. Patient appropriate and stable for d/c with PCP and GI follow up for further evaluation of symptoms. Referral given as well as Rx for zofran and prilosec. Return precautions discussed and patient agreeable to plan with no unaddressed concerns.    Antony Madura, PA-C 10/08/13 2133

## 2013-10-05 NOTE — ED Notes (Signed)
Pt reports relief from nausea, resting on stretcher with visitor at bedside.

## 2013-10-05 NOTE — ED Notes (Signed)
Pt reports continued abdominal pain, is restless on stretcher.  Pt repositioned for comfort with additional warm blankets applied.  Boyfriend at bedside, pt is alert, oriented; PIV to R FA has no IV-related problems - normal saline infusing.

## 2013-10-05 NOTE — ED Notes (Signed)
Pt reports relief from nausea, requesting ginger ale, given over ice.

## 2013-10-05 NOTE — ED Notes (Signed)
Pt knows we need a urine sample 

## 2013-10-13 NOTE — ED Provider Notes (Signed)
Medical screening examination/treatment/procedure(s) were performed by non-physician practitioner and as supervising physician I was immediately available for consultation/collaboration.   Roney Marion, MD 10/13/13 [phone]

## 2013-11-03 ENCOUNTER — Other Ambulatory Visit: Payer: Self-pay

## 2013-12-09 ENCOUNTER — Telehealth: Payer: Self-pay | Admitting: Obstetrics & Gynecology

## 2013-12-09 DIAGNOSIS — R112 Nausea with vomiting, unspecified: Secondary | ICD-10-CM

## 2013-12-09 NOTE — Telephone Encounter (Signed)
Patient said she hasnt been feeling well. Nausea, cramping, frequent loose bowels, Shakey hands, rapid heart beat. Said " i read some information about overactive thyroid and i think i have that i have all of the symptoms". Patient wants to come in for an appt.

## 2013-12-09 NOTE — Telephone Encounter (Signed)
Message left to return call to Maximos Zayas at [phone].    

## 2013-12-13 NOTE — Telephone Encounter (Signed)
Patient has appointment for Dr. Loreta Ave at 12/18 at 1030. Patient aware.

## 2013-12-13 NOTE — Telephone Encounter (Signed)
Really needs GI referral.  Can you see if Dr. Loreta Ave can see her?  Dr. Loreta Ave going to [place] x 2 weeks so please call today.  I don't know when she is leaving.

## 2013-12-13 NOTE — Telephone Encounter (Signed)
Patient returned call.  C/o symptoms x 3 months. Has been seen by pcp, seen here by Debbi and seen in ER. States that she has had ongoing nausea, vomiting, frequent daily bowel movements soon after eating, that she describes as loose and sticky. Has R hand that is visibly shaking sometimes.  No fevers. Has abdominal cramps with bowel movements.  Intermittently tearful during our call. Patient does have a pcp at [facility] but is on Maternity leave and does not want to see another provider at [facility]   She is taking Prilosec, phenergan and is supposed to take a daily probiotic but is unable to tolerate that. She is requesting thyroid testing and an office visit with Dr. Hyacinth Meeker. Advised would send message to Dr. Hyacinth Meeker to ensure this was the best route for her to go at this time. Dr. Hyacinth Meeker, can I squeeze patient in sometime this week with you?

## 2013-12-13 NOTE — Telephone Encounter (Signed)
Called Juliette Alcide at Dr. Trilby Drummer office. She will talk to Dr. Loreta Ave and return my call.

## 2014-02-17 ENCOUNTER — Telehealth: Payer: Self-pay | Admitting: Obstetrics & Gynecology

## 2014-02-17 ENCOUNTER — Ambulatory Visit: Payer: Managed Care, Other (non HMO) | Admitting: Obstetrics & Gynecology

## 2014-02-17 NOTE — Telephone Encounter (Signed)
Patient canceled appointment today for "vaginal irritation". Patient is unable to leave work early today, rescheduled to 02/20/14 @ 8:30 with Dr. Hyacinth MeekerMiller.

## 2014-02-20 ENCOUNTER — Telehealth: Payer: Self-pay | Admitting: Obstetrics & Gynecology

## 2014-02-20 ENCOUNTER — Ambulatory Visit: Payer: Managed Care, Other (non HMO) | Admitting: Obstetrics & Gynecology

## 2014-02-20 NOTE — Telephone Encounter (Signed)
This patient did not show for her appointment with Dr. Hyacinth MeekerMiller for vaginal irritation today. I called the patient and left a message to call back to reschedule.

## 2014-04-24 ENCOUNTER — Observation Stay (HOSPITAL_COMMUNITY)
Admission: EM | Admit: 2014-04-24 | Discharge: 2014-04-26 | Disposition: A | Discharge status: Home / Self Care | Payer: BC Managed Care – PPO | Attending: Internal Medicine | Admitting: Internal Medicine

## 2014-04-24 ENCOUNTER — Emergency Department (HOSPITAL_COMMUNITY): Payer: BC Managed Care – PPO

## 2014-04-24 ENCOUNTER — Encounter (HOSPITAL_COMMUNITY): Payer: Self-pay | Admitting: Emergency Medicine

## 2014-04-24 DIAGNOSIS — F411 Generalized anxiety disorder: Secondary | ICD-10-CM | POA: Insufficient documentation

## 2014-04-24 DIAGNOSIS — E0500 Thyrotoxicosis with diffuse goiter without thyrotoxic crisis or storm: Secondary | ICD-10-CM | POA: Insufficient documentation

## 2014-04-24 DIAGNOSIS — Z681 Body mass index (BMI) 19 or less, adult: Secondary | ICD-10-CM | POA: Insufficient documentation

## 2014-04-24 DIAGNOSIS — R5381 Other malaise: Secondary | ICD-10-CM | POA: Insufficient documentation

## 2014-04-24 DIAGNOSIS — I10 Essential (primary) hypertension: Secondary | ICD-10-CM | POA: Diagnosis present

## 2014-04-24 DIAGNOSIS — R5383 Other fatigue: Secondary | ICD-10-CM

## 2014-04-24 DIAGNOSIS — R634 Abnormal weight loss: Secondary | ICD-10-CM | POA: Insufficient documentation

## 2014-04-24 DIAGNOSIS — R000 Tachycardia, unspecified: Secondary | ICD-10-CM

## 2014-04-24 DIAGNOSIS — E0590 Thyrotoxicosis, unspecified without thyrotoxic crisis or storm: Principal | ICD-10-CM | POA: Diagnosis present

## 2014-04-24 DIAGNOSIS — R109 Unspecified abdominal pain: Secondary | ICD-10-CM | POA: Insufficient documentation

## 2014-04-24 DIAGNOSIS — R002 Palpitations: Secondary | ICD-10-CM | POA: Insufficient documentation

## 2014-04-24 DIAGNOSIS — E43 Unspecified severe protein-calorie malnutrition: Secondary | ICD-10-CM | POA: Diagnosis present

## 2014-04-24 LAB — CBC WITH DIFFERENTIAL/PLATELET
BASOS ABS: 0 10*3/uL (ref 0.0–0.1)
BASOS PCT: 0 % (ref 0–1)
Eosinophils Absolute: 0.1 10*3/uL (ref 0.0–0.7)
Eosinophils Relative: 2 % (ref 0–5)
HCT: 40.8 % (ref 36.0–46.0)
Hemoglobin: 14.6 g/dL (ref 12.0–15.0)
Lymphocytes Relative: 49 % — ABNORMAL HIGH (ref 12–46)
Lymphs Abs: 2.9 10*3/uL (ref 0.7–4.0)
MCH: 27.2 pg (ref 26.0–34.0)
MCHC: 35.8 g/dL (ref 30.0–36.0)
MCV: 76.1 fL — ABNORMAL LOW (ref 78.0–100.0)
MONO ABS: 0.6 10*3/uL (ref 0.1–1.0)
Monocytes Relative: 10 % (ref 3–12)
NEUTROS ABS: 2.3 10*3/uL (ref 1.7–7.7)
NEUTROS PCT: 39 % — AB (ref 43–77)
PLATELETS: 273 10*3/uL (ref 150–400)
RBC: 5.36 MIL/uL — ABNORMAL HIGH (ref 3.87–5.11)
RDW: 14.5 % (ref 11.5–15.5)
WBC: 6 10*3/uL (ref 4.0–10.5)

## 2014-04-24 LAB — COMPREHENSIVE METABOLIC PANEL
ALBUMIN: 3.3 g/dL — AB (ref 3.5–5.2)
ALT: 76 U/L — AB (ref 0–35)
AST: 47 U/L — AB (ref 0–37)
Alkaline Phosphatase: 135 U/L — ABNORMAL HIGH (ref 39–117)
BILIRUBIN TOTAL: 0.3 mg/dL (ref 0.3–1.2)
BUN: 11 mg/dL (ref 6–23)
CHLORIDE: 99 meq/L (ref 96–112)
CO2: 26 mEq/L (ref 19–32)
Calcium: 10.9 mg/dL — ABNORMAL HIGH (ref 8.4–10.5)
Creatinine, Ser: 0.69 mg/dL (ref 0.50–1.10)
GFR calc Af Amer: >90 mL/min (ref >90)
GFR calc non Af Amer: >90 mL/min (ref >90)
Glucose, Bld: 108 mg/dL — ABNORMAL HIGH (ref 70–99)
POTASSIUM: 4.3 meq/L (ref 3.7–5.3)
SODIUM: 138 meq/L (ref 137–147)
Total Protein: 7.5 g/dL (ref 6.0–8.3)

## 2014-04-24 LAB — URINALYSIS, ROUTINE W REFLEX MICROSCOPIC
Bilirubin Urine: NEGATIVE
GLUCOSE, UA: NEGATIVE mg/dL
Hgb urine dipstick: NEGATIVE
KETONES UR: 15 mg/dL — AB
LEUKOCYTES UA: NEGATIVE
Nitrite: NEGATIVE
PH: 6 (ref 5.0–8.0)
Protein, ur: NEGATIVE mg/dL
Specific Gravity, Urine: 1.026 (ref 1.005–1.030)
Urobilinogen, UA: 1 mg/dL (ref 0.0–1.0)

## 2014-04-24 LAB — LIPASE, BLOOD: Lipase: 30 U/L (ref 11–59)

## 2014-04-24 LAB — PREGNANCY, URINE: PREG TEST UR: NEGATIVE

## 2014-04-24 LAB — TSH: TSH: <0.005 u[IU]/mL — ABNORMAL LOW (ref 0.350–4.500)

## 2014-04-24 MED ORDER — ATENOLOL 25 MG PO TABS
25.0000 mg | ORAL_TABLET | Freq: Every day | ORAL | Status: DC
  Administered 2014-04-25 (×2): 25 mg via ORAL
  Filled 2014-04-24 (×3): qty 1

## 2014-04-24 MED ORDER — SODIUM CHLORIDE 0.9 % IV BOLUS (SEPSIS)
1000.0000 mL | Freq: Once | INTRAVENOUS | Status: AC
  Administered 2014-04-24: 1000 mL via INTRAVENOUS

## 2014-04-24 MED ORDER — LORAZEPAM 2 MG/ML IJ SOLN
1.0000 mg | Freq: Once | INTRAMUSCULAR | Status: AC
  Administered 2014-04-24: 0.5 mg via INTRAVENOUS
  Filled 2014-04-24: qty 1

## 2014-04-24 MED ORDER — METOPROLOL TARTRATE 1 MG/ML IV SOLN
5.0000 mg | Freq: Once | INTRAVENOUS | Status: AC
  Administered 2014-04-24: 5 mg via INTRAVENOUS
  Filled 2014-04-24: qty 5

## 2014-04-24 NOTE — ED Provider Notes (Signed)
CSN: [id number]     Arrival date & time 04/24/14  1249 History   First MD Initiated Contact with Patient 04/24/14 1810     Chief Complaint  Patient presents with  . Weight Loss     (Consider location/radiation/quality/duration/timing/severity/associated sxs/prior Treatment) HPI Pt presenting with c/o 4 weeks of weight loss, decreased appetite, has also had some nausea as well as feeling jittery.  No fever/chills.  Has continued to drink liquids well.  No vomiting.  No changes in stools.  No change in sensation of heat or cold.  She feels she has lost approx 15-20 pounds over the past month.  No chest pain or shorthess of breath.  There are no other associated systemic symptoms, there are no other alleviating or modifying factors.   Past Medical History  Diagnosis Date  . Anxiety   . HSV-2 infection   . Hypertension    Past Surgical History  Procedure Laterality Date  . Bartholin gland cyst excision  10/05  . Cesarean section  1993, 1996    x2  . Tubal ligation  1996   Family History  Problem Relation Age of Onset  . Hyperlipidemia Mother   . Hypertension Mother   . Diabetes Father    History  Substance Use Topics  . Smoking status: Never Smoker   . Smokeless tobacco: Never Used  . Alcohol Use: 0.5 oz/week    1 drink(s) per week   OB History   Grav Para Term Preterm Abortions TAB SAB Ect Mult Living   3 2        2      Review of Systems ROS reviewed and all otherwise negative except for mentioned in HPI    Allergies  Review of patient's allergies indicates no known allergies.  Home Medications   Prior to Admission medications   Medication Sig Start Date End Date Taking? Authorizing Provider  Multiple Vitamin (MULTIVITAMIN) capsule Take 1 capsule by mouth daily.   Yes Historical Provider, MD   BP 125/65  Pulse 133  Temp(Src) 98 F (36.7 C) (Oral)  Resp 22  Ht 5\' 9"  (1.753 m)  Wt 99 lb (44.906 kg)  BMI 14.61 kg/m2  SpO2 99% Vitals reviewed Physical  Exam Physical Examination: General appearance - alert, well appearing, and in no distress Mental status - alert, oriented to person, place, and time Eyes - pupils equal and reactive, extraocular eye movements intact, proptosis bilaterally Mouth - mucous membranes moist, pharynx normal without lesions Chest - clear to auscultation, no wheezes, rales or rhonchi, symmetric air entry Heart - normal rate, regular rhythm, normal S1, S2, no murmurs, rubs, clicks or gallops Abdomen - soft, nontender, nondistended, no masses or organomegaly Neurological - alert, oriented, normal speech, moving all extremities well Extremities - peripheral pulses normal, no pedal edema, no clubbing or cyanosis Skin - normal coloration and turgor, no rashes  ED Course  Procedures (including critical care time)  11:26 PM d/w Dr. Toniann FailKakrakandy, [facility]- pt to be admitted to stepdown.    Date: 04/24/2014  Rate: 128  Rhythm: sinus tachycardia  QRS Axis: normal  Intervals: normal  ST/T Wave abnormalities: normal  Conduction Disutrbances:none  Narrative Interpretation:   Old EKG Reviewed: none available  Labs Review Labs Reviewed  CBC WITH DIFFERENTIAL - Abnormal; Notable for the following:    RBC 5.36 (*)    MCV 76.1 (*)    Neutrophils Relative % 39 (*)    Lymphocytes Relative 49 (*)    All other components within  normal limits  COMPREHENSIVE METABOLIC PANEL - Abnormal; Notable for the following:    Glucose, Bld 108 (*)    Calcium 10.9 (*)    Albumin 3.3 (*)    AST 47 (*)    ALT 76 (*)    Alkaline Phosphatase 135 (*)    All other components within normal limits  URINALYSIS, ROUTINE W REFLEX MICROSCOPIC - Abnormal; Notable for the following:    Ketones, ur 15 (*)    All other components within normal limits  TSH - Abnormal; Notable for the following:    TSH <0.005 (*)    All other components within normal limits  LIPASE, BLOOD  PREGNANCY, URINE  T4, FREE  T3, REVERSE  T3    Imaging Review Us  Abdomen Limited  04/24/2014   CLINICAL [place]DATA:  Weight loss.  EXAM: US ABDOMEN LIMITED - RIGHT UPPER QUADRANT  COMPARISON:  None.  FINDINGS: Gallbladder:  Contracted; the patient ate right before the exam. No gallstones or wall thickening visualized. No sonographic Murphy sign noted.  Common bile duct:  Diameter: 0.4 cm, within normal limits in caliber.  Liver:  No focal lesion identified. Within normal limits in parenchymal echogenicity.  IMPRESSION: Unremarkable ultrasound of the right upper quadrant.   Electronically Signed   By: Roanna RaiderJeffery  Chang M.D.   On: 04/24/2014 22:04     EKG Interpretation None      MDM   Final diagnoses:  Hyperthyroidism  Tachycardia    Pt with persistent tachcardia, weight loss, low TSH.  Suspect hyperthyroidism.  Pt has normal mental status, no chest pain or tachycardia.  D/w hosptiatlist for admission to step down.  Given dose of beta blocker in the ED.     Ethelda ChickMartha K Linker, MD 04/25/14 [phone]

## 2014-04-24 NOTE — ED Notes (Signed)
Pt reports weight loss of 15lbs over the past month- admits to loss of appetite as well.  Pt reports LLQ pain that comes and goes, N/V associated with symptoms.  Denies pain at present.

## 2014-04-24 NOTE — ED Notes (Signed)
Pt states loss of appitie and weight loss over last month. Pt denies n/v/d, pain. States she is drinking enough liquids just does not have an appetites.

## 2014-04-24 NOTE — ED Notes (Signed)
Patient transported to Ultrasound 

## 2014-04-24 NOTE — ED Notes (Signed)
Hospitalist MD at bedside. 

## 2014-04-24 NOTE — ED Notes (Signed)
Pt reports lack of appetite and weight loss x months. States yesterday had some LLQ pain with nausea (denies at this time) but reports eating as normal over last several months. Pt in NAD. AO x4. Denies dizziness, lightheadedness.

## 2014-04-25 ENCOUNTER — Encounter (HOSPITAL_COMMUNITY): Payer: Self-pay | Admitting: Internal Medicine

## 2014-04-25 ENCOUNTER — Inpatient Hospital Stay (HOSPITAL_COMMUNITY): Payer: BC Managed Care – PPO

## 2014-04-25 DIAGNOSIS — R000 Tachycardia, unspecified: Secondary | ICD-10-CM

## 2014-04-25 DIAGNOSIS — I10 Essential (primary) hypertension: Secondary | ICD-10-CM

## 2014-04-25 DIAGNOSIS — E0590 Thyrotoxicosis, unspecified without thyrotoxic crisis or storm: Secondary | ICD-10-CM

## 2014-04-25 LAB — RAPID URINE DRUG SCREEN, HOSP PERFORMED
Amphetamines: NOT DETECTED
Barbiturates: NOT DETECTED
Benzodiazepines: NOT DETECTED
COCAINE: NOT DETECTED
Opiates: NOT DETECTED
TETRAHYDROCANNABINOL: POSITIVE — AB

## 2014-04-25 LAB — HEPATITIS PANEL, ACUTE
HCV AB: NEGATIVE
HEP A IGM: NONREACTIVE
HEP B C IGM: NONREACTIVE
Hepatitis B Surface Ag: NEGATIVE

## 2014-04-25 LAB — T3: T3, Total: >800 ng/dl — ABNORMAL HIGH (ref 80.0–204.0)

## 2014-04-25 LAB — MRSA PCR SCREENING: MRSA by PCR: NEGATIVE

## 2014-04-25 LAB — T4, FREE: Free T4: 7.08 ng/dL — ABNORMAL HIGH (ref 0.80–1.80)

## 2014-04-25 MED ORDER — SODIUM CHLORIDE 0.9 % IJ SOLN
3.0000 mL | Freq: Two times a day (BID) | INTRAMUSCULAR | Status: DC
  Administered 2014-04-25 (×3): 3 mL via INTRAVENOUS

## 2014-04-25 MED ORDER — ACETAMINOPHEN 325 MG PO TABS: 650.0000 mg | ORAL_TABLET | Freq: Four times a day (QID) | ORAL | Status: DC | PRN

## 2014-04-25 MED ORDER — ACETAMINOPHEN 650 MG RE SUPP: 650.0000 mg | Freq: Four times a day (QID) | RECTAL | Status: DC | PRN

## 2014-04-25 MED ORDER — ONDANSETRON HCL 4 MG PO TABS: 4.0000 mg | ORAL_TABLET | Freq: Four times a day (QID) | ORAL | Status: DC | PRN

## 2014-04-25 MED ORDER — SODIUM CHLORIDE 0.9 % IV SOLN: INTRAVENOUS | Status: DC

## 2014-04-25 MED ORDER — ENSURE COMPLETE PO LIQD: 237.0000 mL | Freq: Two times a day (BID) | ORAL | Status: DC

## 2014-04-25 MED ORDER — ONDANSETRON HCL 4 MG/2ML IJ SOLN: 4.0000 mg | Freq: Four times a day (QID) | INTRAMUSCULAR | Status: DC | PRN

## 2014-04-25 MED ORDER — METHIMAZOLE 10 MG PO TABS
30.0000 mg | ORAL_TABLET | Freq: Every day | ORAL | Status: DC
  Administered 2014-04-25: 30 mg via ORAL
  Filled 2014-04-25 (×2): qty 3

## 2014-04-25 MED ORDER — ENOXAPARIN SODIUM 40 MG/0.4ML ~~LOC~~ SOLN
40.0000 mg | SUBCUTANEOUS | Status: DC
  Filled 2014-04-25 (×3): qty 0.4

## 2014-04-25 MED ORDER — METHIMAZOLE 10 MG PO TABS: 10.0000 mg | ORAL_TABLET | Freq: Every day | ORAL | Status: DC

## 2014-04-25 NOTE — H&P (Addendum)
[facility] History and Physical  Donna ResidesMarlo Ray UJW:[id number] DOB: 10/23/1971 DOA: 04/24/2014  Referring physician: ER physician. PCP: Gretel AcreNNODI, ADAKU, MD   Chief Complaint: Fatigue and abdominal discomfort and palpitations.  HPI: Donna Ray is a 43 y.o. female with history of hypertension has been experiencing fatigue and weight loss abdominal discomfort and palpitations over the last few weeks. In the ER patient was found to be sinus tachycardia. Labs revealed very low TSH and free T3 and T4 are pending. Patient otherwise denies any chest pain or shortness of breath nausea vomiting. Patient was given metoprolol 5 mg IV one dose following which patient is still tachycardic. Patient probably has thyrotoxicosis and has been admitted for further management.   Review of Systems: As presented in the history of presenting illness, rest negative.  Past Medical History  Diagnosis Date  . Anxiety   . HSV-2 infection   . Hypertension    Past Surgical History  Procedure Laterality Date  . Bartholin gland cyst excision  10/05  . Cesarean section  1993, 1996    x2  . Tubal ligation  1996   Social History:  reports that she has never smoked. She has never used smokeless tobacco. She reports that she drinks about .5 ounces of alcohol per week. She reports that she does not use illicit drugs. Where does patient live home. Can patient participate in ADLs? Yes..  No Known Allergies  Family History:  Family History  Problem Relation Age of Onset  . Hyperlipidemia Mother   . Hypertension Mother   . Diabetes Father       Prior to Admission medications   Medication Sig Start Date End Date Taking? Authorizing Provider  Multiple Vitamin (MULTIVITAMIN) capsule Take 1 capsule by mouth daily.   Yes Historical Provider, MD    Physical Exam: Filed Vitals:   04/24/14 2236 04/24/14 2300 04/24/14 2332 04/24/14 2345  BP: 122/78 116/77 125/70 125/65  Pulse: 130 129 129 133   Temp:      TempSrc:      Resp: 29 29 31 22   Height:      Weight:      SpO2: 99% 100% 100% 99%     General:  Well developed and moderately nourished.  Eyes: Anicteric no pallor mild proptosis.  ENT: No discharge from the ears eyes nose mouth.  Neck: Mildly prominent thyroid.  Cardiovascular: S1-S2 heard tachycardic.  Respiratory: No rhonchi or crepitations.  Abdomen: Soft nontender bowel sounds present. No guarding or rigidity.  Skin: No rash.  Musculoskeletal: No edema.  Psychiatric: Appears normal.  Neurologic: Alert awake oriented to time place and person. Moves all extremities.  Labs on Admission:  Basic Metabolic Panel:  Recent Labs Lab 04/24/14 1339  NA 138  K 4.3  CL 99  CO2 26  GLUCOSE 108*  BUN 11  CREATININE 0.69  CALCIUM 10.9*   Liver Function Tests:  Recent Labs Lab 04/24/14 1339  AST 47*  ALT 76*  ALKPHOS 135*  BILITOT 0.3  PROT 7.5  ALBUMIN 3.3*    Recent Labs Lab 04/24/14 1339  LIPASE 30   No results found for this basename: AMMONIA,  in the last 168 hours CBC:  Recent Labs Lab 04/24/14 1339  WBC 6.0  NEUTROABS 2.3  HGB 14.6  HCT 40.8  MCV 76.1*  PLT 273   Cardiac Enzymes: No results found for this basename: CKTOTAL, CKMB, CKMBINDEX, TROPONINI,  in the last 168 hours  BNP (last 3 results) No results found for  this basename: PROBNP,  in the last 8760 hours CBG: No results found for this basename: GLUCAP,  in the last 168 hours  Radiological Exams on Admission: Koreas Abdomen Limited  04/24/2014   CLINICAL DATA:  Weight loss.  EXAM: US ABDOMEN LIMITED - RIGHT UPPER QUADRANT  COMPARISON:  None.  FINDINGS: Gallbladder:  Contracted; the patient ate right before the exam. No gallstones or wall thickening visualized. No sonographic Murphy sign noted.  Common bile duct:  Diameter: 0.4 cm, within normal limits in caliber.  Liver:  No focal lesion identified. Within normal limits in parenchymal echogenicity.  IMPRESSION:  Unremarkable ultrasound of the right upper quadrant.   Electronically Signed   By: Roanna RaiderJeffery  Chang M.D.   On: 04/24/2014 22:04     Assessment/Plan Principal Problem:   Hyperthyroidism Active Problems:   HTN (hypertension)   1. Possible hyperthyroidism - patient's clinical features and low TSH are indicative of possible thyrotoxicosis. Free T4 and T3 are pending. For now I have ordered Atenolol 25 mg by mouth daily. If patient's free T4 or T3 is high we may probably have to consider endocrinology given patient's elevated LFTs. 2. Elevated LFTs - check acute hepatitis panel. Sonogram of the abdomen was unremarkable. Follow LFTs. 3. Hypercalcemia - check intact parathormone and vitamin D levels. 4. Hypertension - continue home medications. Medication name has to be verified continued.  EKG to be followed.  Code Status: Full code.  Family Communication: Son at the bedside.  Disposition Plan: Admit to inpatient.    Eduard ClosArshad N Raea Magallon [facility] Pager [phone].  If 7PM-7AM, please contact night-coverage [email or web address] Password [email or web address] 04/25/2014, 12:42 AM

## 2014-04-25 NOTE — Progress Notes (Signed)
[facility] PROGRESS NOTE  Zenia ResidesMarlo Kunkler-Wiggins WUJ:[id number] DOB: 03/24/1971 DOA: 04/24/2014 PCP: Gretel AcreNNODI, ADAKU, MD  Assessment/Plan: Hyperthyroidism Patient has a low TSH and elevated free T3 and T4. Patient reports significant weight loss and palpitations. Also has margins of Thomas exam but denies any visual symptoms. If it is elevated could be due to hyperthyroidism. Liver US unremarkable. -Start her on low dose atenolol. [facility] . Recommended to start on low dose methimazole ( will start on 30 mg daily). check thyroid stimulating immunoglobulins and thyroid US. recommends repeat cbc with differential and LFTs in 1 week. Patient will need repeat TSH , free T4 and T3 in 4 weeks.  He agrees to see patient as outpt. Will arrange for f/up  Hypercalcemia Check intact PTH and vitamin D level.  Transfer pt to tele    Code Status: Full code Family Communication: Son at bedside  Disposition Plan: home likely in am   Consultants:  None ( d/w Dr Sharl MaKerr over the phone)  Procedures:  none  Antibiotics:  none  HPI/Subjective: Patient seen and examined this morning. Admission H&P is reviewed. Reports some palpitations  Objective: Filed Vitals:   04/25/14 1123  BP: 116/56  Pulse: 112  Temp:   Resp: 17    Intake/Output Summary (Last 24 hours) at 04/25/14 1343 Last data filed at 04/25/14 0143  Gross per 24 hour  Intake   2003 ml  Output      0 ml  Net   2003 ml   Filed Weights   04/24/14 1335 04/25/14 0120  Weight: 44.906 kg (99 lb) 46.494 kg (102 lb 8 oz)    Exam:   General:   middle aged thin female in no acute distress  HEENT: No pallor, mild exophthalmos,  moist oral mucosa   Chest: Clear to auscultation   Cardiovascular:  S1 and S2 tachycardic, no murmurs or gallop  Abdomen: Soft, nontender, nondistended normal bowel symptoms  Extremities: Warm, no edema  CNS: AAOx  times     Data Reviewed: Basic  Metabolic Panel:  Recent Labs Lab 04/24/14 1339  NA 138  K 4.3  CL 99  CO2 26  GLUCOSE 108*  BUN 11  CREATININE 0.69  CALCIUM 10.9*   Liver Function Tests:  Recent Labs Lab 04/24/14 1339  AST 47*  ALT 76*  ALKPHOS 135*  BILITOT 0.3  PROT 7.5  ALBUMIN 3.3*    Recent Labs Lab 04/24/14 1339  LIPASE 30   No results found for this basename: AMMONIA,  in the last 168 hours CBC:  Recent Labs Lab 04/24/14 1339  WBC 6.0  NEUTROABS 2.3  HGB 14.6  HCT 40.8  MCV 76.1*  PLT 273   Cardiac Enzymes: No results found for this basename: CKTOTAL, CKMB, CKMBINDEX, TROPONINI,  in the last 168 hours BNP (last 3 results) No results found for this basename: PROBNP,  in the last 8760 hours CBG: No results found for this basename: GLUCAP,  in the last 168 hours  Recent Results (from the past 240 hour(s))  MRSA PCR SCREENING     Status: None   Collection Time    04/25/14  2:00 AM      Result Value Ref Range Status   MRSA by PCR NEGATIVE  NEGATIVE Final   Comment:            The GeneXpert MRSA Assay (FDA     approved for NASAL specimens     only), is one  component of a     comprehensive MRSA colonization     surveillance program. It is not     intended to diagnose MRSA     infection nor to guide or     monitor treatment for     MRSA infections.     Studies: Koreas Abdomen Limited  04/24/2014   CLINICAL DATA:  Weight loss.  EXAM: US ABDOMEN LIMITED - RIGHT UPPER QUADRANT  COMPARISON:  None.  FINDINGS: Gallbladder:  Contracted; the patient ate right before the exam. No gallstones or wall thickening visualized. No sonographic Murphy sign noted.  Common bile duct:  Diameter: 0.4 cm, within normal limits in caliber.  Liver:  No focal lesion identified. Within normal limits in parenchymal echogenicity.  IMPRESSION: Unremarkable ultrasound of the right upper quadrant.   Electronically Signed   By: Roanna RaiderJeffery  Chang M.D.   On: 04/24/2014 22:04    Scheduled Meds: . atenolol  25 mg  Oral Daily  . enoxaparin (LOVENOX) injection  40 mg Subcutaneous Q24H  . methimazole  30 mg Oral Daily  . sodium chloride  3 mL Intravenous Q12H   Continuous Infusions: . sodium chloride        Time spent: 25 minutes    Ysabella Babiarz  [facility] Pager [phone]. If 7PM-7AM, please contact night-coverage at [email or web address], password [email or web address] 04/25/2014, 1:43 PM  LOS: 1 day

## 2014-04-25 NOTE — Progress Notes (Addendum)
INITIAL NUTRITION ASSESSMENT  DOCUMENTATION CODES Per approved criteria  -Severe malnutrition in the context of chronic illness   INTERVENTION: Add Ensure Complete po BID, each supplement provides 350 kcal and 13 grams of protein. RD to continue to follow nutrition care plan.  NUTRITION DIAGNOSIS: Unintentional weight loss related to thyroid disease as evidenced by 13% wt loss x <6 months.   Goal: Intake to meet >90% of estimated nutrition needs.  Monitor:  weight trends, lab trends, I/O's, PO intake, supplement tolerance  Reason for Assessment: Malnutrition Screening Tool  43 y.o. female  Admitting Dx: Hyperthyroidism  ASSESSMENT: PMHx significant for HTN. Admitted with fatigue and weight loss, abdominal discomfort and palpitations x a few weeks. Work-up reveals possible hyperthyroidism.  Currently ordered for a Regular diet. Eating about 25% of her meals. She states that she weighed 128 lb about 2 months ago, however this is not consistent with the EPIC weight hx. Regardless of this, pt has had significant weight loss of 13% in < 6 months. She appears thin. She has tried Ensure at home a few times. Has limited appetite.  Pt meets criteria for severe MALNUTRITION in the context of chronic illness as evidenced by 13% wt loss x 6 months and intake of <75% x at least 1 month.  Height: Ht Readings from Last 1 Encounters:  04/25/14 5\' 9"  (1.753 m)    Weight: Wt Readings from Last 1 Encounters:  04/25/14 102 lb 8 oz (46.494 kg)    Ideal Body Weight: 145 lb  % Ideal Body Weight: 70%  Wt Readings from Last 10 Encounters:  04/25/14 102 lb 8 oz (46.494 kg)  09/28/13 117 lb (53.071 kg)  07/28/13 132 lb 9.6 oz (60.147 kg)    Usual Body Weight: 117 lb  % Usual Body Weight: 87%  BMI:  Body mass index is 15.13 kg/(m^2). Underweight  Estimated Nutritional Needs: Kcal: 1400 - 1600 Protein: at least 56 g daily Fluid: 1.4 - 1.6 liters daily  Skin: intact  Diet Order:  General  EDUCATION NEEDS: -No education needs identified at this time   Intake/Output Summary (Last 24 hours) at 04/25/14 1324 Last data filed at 04/25/14 0143  Gross per 24 hour  Intake   2003 ml  Output      0 ml  Net   2003 ml    Last BM: 4/27  Labs:   Recent Labs Lab 04/24/14 1339  NA 138  K 4.3  CL 99  CO2 26  BUN 11  CREATININE 0.69  CALCIUM 10.9*  GLUCOSE 108*    CBG (last 3)  No results found for this basename: GLUCAP,  in the last 72 hours  Scheduled Meds: . atenolol  25 mg Oral Daily  . enoxaparin (LOVENOX) injection  40 mg Subcutaneous Q24H  . methimazole  30 mg Oral Daily  . sodium chloride  3 mL Intravenous Q12H    Continuous Infusions: . sodium chloride      Past Medical History  Diagnosis Date  . Anxiety   . HSV-2 infection   . Hypertension     Past Surgical History  Procedure Laterality Date  . Bartholin gland cyst excision  10/05  . Cesarean section  1993, 1996    x2  . Tubal ligation  1996    Jarold MottoSamantha Mercy Leppla MS, RD, LDN Inpatient Registered Dietitian Pager: [phone] After-hours pager: [phone]

## 2014-04-25 NOTE — Progress Notes (Signed)
Pt transferred to 2W17 via wheelchair. VSS. All belongings with pt. Pt is holding pocketbook and cell phone. Report given to Kathlene NovemberMike, RN and all questions answered.

## 2014-04-25 NOTE — Progress Notes (Signed)
Utilization review completed.  

## 2014-04-26 DIAGNOSIS — E43 Unspecified severe protein-calorie malnutrition: Secondary | ICD-10-CM

## 2014-04-26 DIAGNOSIS — E0590 Thyrotoxicosis, unspecified without thyrotoxic crisis or storm: Secondary | ICD-10-CM | POA: Diagnosis present

## 2014-04-26 LAB — PARATHYROID HORMONE, INTACT (NO CA): PTH: 5.8 pg/mL — AB (ref 14.0–72.0)

## 2014-04-26 MED ORDER — METHIMAZOLE 10 MG PO TABS: 30.0000 mg | ORAL_TABLET | Freq: Every day | ORAL | Status: DC

## 2014-04-26 MED ORDER — ATENOLOL 25 MG PO TABS: 25.0000 mg | ORAL_TABLET | Freq: Every day | ORAL | Status: DC

## 2014-04-26 NOTE — Progress Notes (Signed)
Ms. Zenia ResidesMarlo Acoff-Wiggins is unable to work from 04/24/14 - 04/30/14 due to medical condition.  Sincerely,    Crista Curborinna Khelani Kops, M.D. [facility]

## 2014-04-26 NOTE — Clinical Documentation Improvement (Signed)
Possible Clinical Conditions?   Severe Malnutrition   Severe Protein Calorie Malnutrition Emaciation  Cachexia   Other Condition Cannot clinically determine   Risk Factors: (per RD's 04/25/14 evaluation): Meets criteria for Severe Malnutrition in the context of chronic illness as evidenced by 13% weight loss x 6 months and intake of <75% x at least 1 month. Underweight with a BMI of 15.13.  Treatment: Ensure Complete po BID   Thank You, Marciano SequinWanda Mathews-Bethea,RN,BSN, Clinical Documentation Specialist:  [phone]  [facility] Information Management

## 2014-04-26 NOTE — Progress Notes (Signed)
Patient refuses Lovenox s.q.

## 2014-04-26 NOTE — Discharge Summary (Signed)
Physician Discharge Summary  Donna Ray ZOX:[id number] DOB: 04/29/1971 DOA: 04/24/2014  PCP: Gretel AcreNNODI, ADAKU, MD  Admit date: 04/24/2014 Discharge date: 04/26/2014  Recommendations for Outpatient Follow-up:  1. Repeat CBC with diff and LFTs in one week 2. Repeat TSH, fT4, T3 in 4 weeks 3. Follow up thyroid  Stimulating immunoglobulins  Discharge Diagnoses:  Principal Problem:   Thyrotoxicosis Active Problems:   HTN (hypertension)   Protein-calorie malnutrition, severe Elevated LFTs  Discharge Condition: stable  Filed Weights   04/24/14 1335 04/25/14 0120  Weight: 44.906 kg (99 lb) 46.494 kg (102 lb 8 oz)    History of present illness:   43 y.o. female with history of hypertension has been experiencing fatigue and weight loss abdominal discomfort and palpitations over the last few weeks. In the ER patient was found to be sinus tachycardia. Labs revealed very low TSH and free T3 and T4 are pending. Patient otherwise denies any chest pain or shortness of breath nausea vomiting. Patient was given metoprolol 5 mg IV one dose following which patient is still tachycardic. Patient probably has thyrotoxicosis and has been admitted for further management.   Hospital Course:  Admitted to hospitalists on telemetry. Started on atenolol and PTU.  TSH <0.005. FT4 7.08.  T3 >800.  Discussed management with Dr. Sharl MaKerr who agreed with atenolol and PTU, recommended thyroid US. Showed diffuse, enlarged, hyperemic thyroid without nodule.   Patient feeling better, but still slightly "jittery". HR 105, sinus tach.  Will follow up with Dr. Sharl MaKerr this afternoon.  Note for work given  LFTs minimally elevated. US and acute hepatitis panel negative  Procedures:  none  Consultations:  Sharl MaKerr by phone  Discharge Exam: Filed Vitals:   04/26/14 0326  BP: 131/77  Pulse: 104  Temp: 98.4 F (36.9 C)  Resp: 20    General: alert, oriented Cardiovascular: tachy, regular Respiratory: CTA without  WRR Ext no CCE  Discharge Instructions You were cared for by a hospitalist during your hospital stay. If you have any questions about your discharge medications or the care you received while you were in the hospital after you are discharged, you can call the unit and asked to speak with the hospitalist on call if the hospitalist that took care of you is not available. Once you are discharged, your primary care physician will handle any further medical issues. Please note that NO REFILLS for any discharge medications will be authorized once you are discharged, as it is imperative that you return to your primary care physician (or establish a relationship with a primary care physician if you do not have one) for your aftercare needs so that they can reassess your need for medications and monitor your lab values.  Discharge Orders   Future Appointments Provider Department Dept Phone   10/10/2014 2:00 PM Annamaria BootsMary Suzanne Miller, MD [facility] [phone]   Future Orders Complete By Expires   Activity as tolerated - No restrictions  As directed    Diet general  As directed        Medication List         atenolol 25 MG tablet  Commonly known as:  TENORMIN  Take 1 tablet (25 mg total) by mouth daily.     methimazole 10 MG tablet  Commonly known as:  TAPAZOLE  Take 3 tablets (30 mg total) by mouth daily.     multivitamin capsule  Take 1 capsule by mouth daily.       No Known Allergies  Follow-up Information   Follow up with NNODI, ADAKU, MD In 4 weeks.   Specialty:  Family Medicine   Contact information:   [address] [place] [address] [phone]       Follow up with KERR,JEFFREY, MD. (his office will call you for appointment)    Specialty:  Endocrinology   Contact information:   [address] [place] [address] [phone]        The results of significant diagnostics from this hospitalization (including imaging,  microbiology, ancillary and laboratory) are listed below for reference.    Significant Diagnostic Studies: Kore[place] Soft Tissue Head/neck  04/25/2014   CLINICAL DATA:  Hyperthyroidism  EXAM: THYROID ULTRASOUND  TECHNIQUE: Ultrasound examination of the thyroid gland and adjacent soft tissues was performed.  COMPARISON:  None.  FINDINGS: Right thyroid lobe  Measurements: 60 x 17 x 20 mm. Hyperemic, inhomogeneous. No nodules visualized.  Left thyroid lobe  Measurements: 63 x 16 x 21 mm.  No nodules visualized.  Isthmus  Thickness: 6 mm.  No nodules visualized.  Lymphadenopathy  None visualized.  IMPRESSION: 1. Enlarged hyperemic thyroid without nodule or other discrete lesion.   Electronically Signed   By: Oley Balmaniel  Hassell M.D.   On: 04/25/2014 14:17   [place] Abdomen Limited  04/24/2014   CLINICAL DATA:  Weight loss.  EXAM: US ABDOMEN LIMITED - RIGHT UPPER QUADRANT  COMPARISON:  None.  FINDINGS: Gallbladder:  Contracted; the patient ate right before the exam. No gallstones or wall thickening visualized. No sonographic Murphy sign noted.  Common bile duct:  Diameter: 0.4 cm, within normal limits in caliber.  Liver:  No focal lesion identified. Within normal limits in parenchymal echogenicity.  IMPRESSION: Unremarkable ultrasound of the right upper quadrant.   Electronically Signed   By: Roanna RaiderJeffery  Chang M.D.   On: 04/24/2014 22:04    Microbiology: Recent Results (from the past 240 hour(s))  MRSA PCR SCREENING     Status: None   Collection Time    04/25/14  2:00 AM      Result Value Ref Range Status   MRSA by PCR NEGATIVE  NEGATIVE Final   Comment:            The GeneXpert MRSA Assay (FDA     approved for NASAL specimens     only), is one component of a     comprehensive MRSA colonization     surveillance program. It is not     intended to diagnose MRSA     infection nor to guide or     monitor treatment for     MRSA infections.     Labs: Basic Metabolic Panel:  Recent Labs Lab 04/24/14 1339  NA 138   K 4.3  CL 99  CO2 26  GLUCOSE 108*  BUN 11  CREATININE 0.69  CALCIUM 10.9*   Liver Function Tests:  Recent Labs Lab 04/24/14 1339  AST 47*  ALT 76*  ALKPHOS 135*  BILITOT 0.3  PROT 7.5  ALBUMIN 3.3*    Recent Labs Lab 04/24/14 1339  LIPASE 30   No results found for this basename: AMMONIA,  in the last 168 hours CBC:  Recent Labs Lab 04/24/14 1339  WBC 6.0  NEUTROABS 2.3  HGB 14.6  HCT 40.8  MCV 76.1*  PLT 273    Preg Test, Ur    20 mIU/mL."NEGATIVE 20 mIU/mL." border=0 src="file:///C:/PROGRAM%20FILES%20(X86)/EPIC/V7.9/EN-US/Images/IP_COMMENT_EXIST.gif" width=5 height=10        Preg Test, Ur  THYROID     TSH   <0.005         TSH   Free T4      7.08       Free T4   T3, Total      800.0 ng/dl Performed at [facility]">800.0800.0 ng/dl Performed at [facility]" border=0 src="file:///C:/PROGRAM%20FILES%20(X86)/EPIC/V7.9/EN-US/Images/IP_COMMENT_EXIST.gif" width=5 height=10      T3, Total    HEPATITIS A     Hep A IgM          NON REACTIVE  Hep A IgM    HEPATITIS B     Hepatitis B Surface Ag          NEGATIVE  Hepatitis B Surface Ag   Hep B C IgM          NON REACTIVE   Hep B C IgM    HEPATITIS C     HCV Ab          NEGATIVE  HCV Ab    URINALYSIS     Color, Urine    YELLOW        Color, Urine   APPearance    CLEAR        APPearance   Specific Gravity, Urine    1.026        Specific Gravity, Urine   pH    6.0        pH   Glucose, UA    NEGATIVE        Glucose, UA   Bilirubin Urine    NEGATIVE        Bilirubin Urine   Ketones, ur    15        Ketones, ur   Protein, ur    NEGATIVE        Protein, ur   Urobilinogen, UA    1.0        Urobilinogen, UA   Nitrite    NEGATIVE        Nitrite   Leukocytes, UA    NEGATIVE         Leukocytes, UA   Hgb urine dipstick    NEGATIVE        Hgb urine dipstick    URINE CHEMISTRY     Preg Test, Ur    20 mIU/mL."NEGATIVE 20 mIU/mL." border=0  src="file:///C:/PROGRAM%20FILES%20(X86)/EPIC/V7.9/EN-US/Images/IP_COMMENT_EXIST.gif" width=5 height=10        Preg Test, Ur    TOX, URINE     Amphetamines    NONE DETECTED        Amphetamines   Barbiturates    NONE DETECTED         Barbiturates   Benzodiazepines    NONE DETECTED        Benzodiazepines   Opiates    NONE DETECTED        Opiates   Cocaine    NONE DETECTED        Cocaine   Tetrahydrocannabinol    POSITIVE               Signed:  Christiane Ha  [facility] 04/26/2014, 10:58 AM

## 2014-04-27 LAB — T3, REVERSE: T3 REVERSE: 196 ng/dL — AB (ref 8–25)

## 2014-04-29 LAB — THYROID STIMULATING IMMUNOGLOBULIN: TSI: 618 % baseline — ABNORMAL HIGH (ref <140)

## 2014-05-03 LAB — VITAMIN D 1,25 DIHYDROXY: Vitamin D2 1, 25 (OH)2: <8 pg/mL

## 2014-10-06 IMAGING — US US ABDOMEN LIMITED
1 series · 14 of 25 positions shown · non-contrast
Comparison: None.

CLINICAL DATA: Weight loss.

EXAM:
US ABDOMEN LIMITED - RIGHT UPPER QUADRANT

[Series 1: us abdomen limited · 0.21mm/px · 14 of 38 slices shown]
[im 1/38]
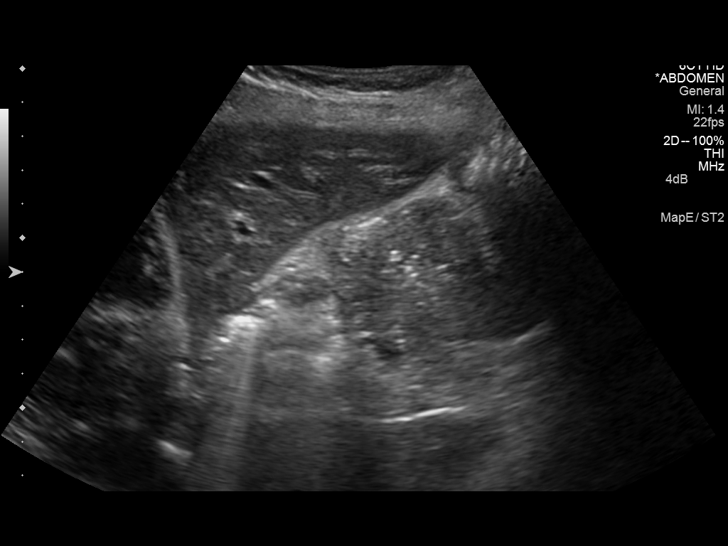
[im 4/38]
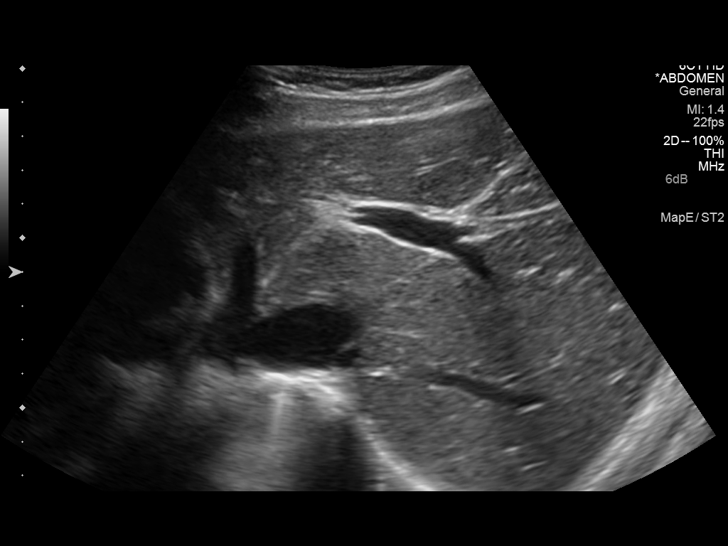
[im 7/38]
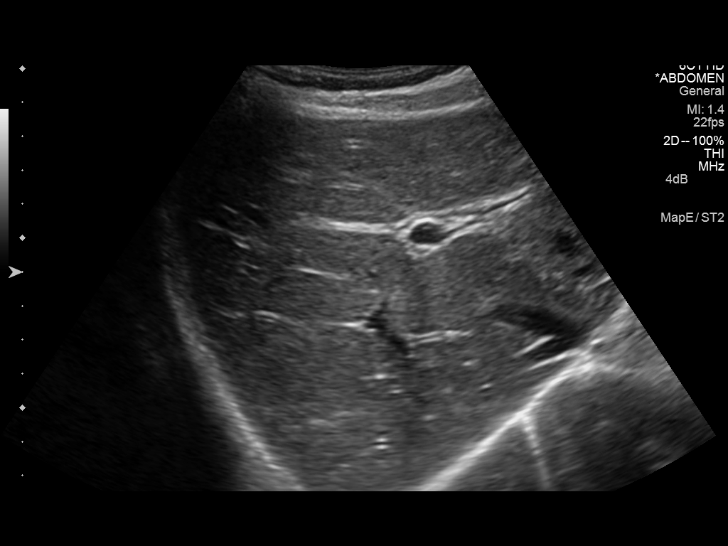
[im 10/38]
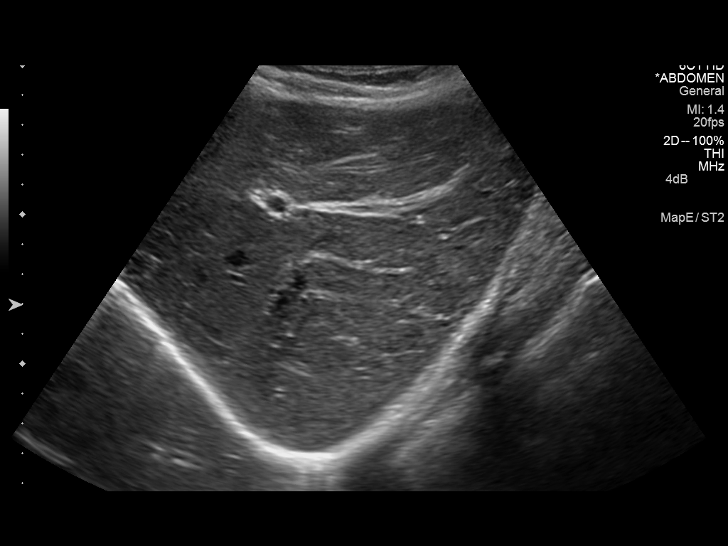
[im 13/38]
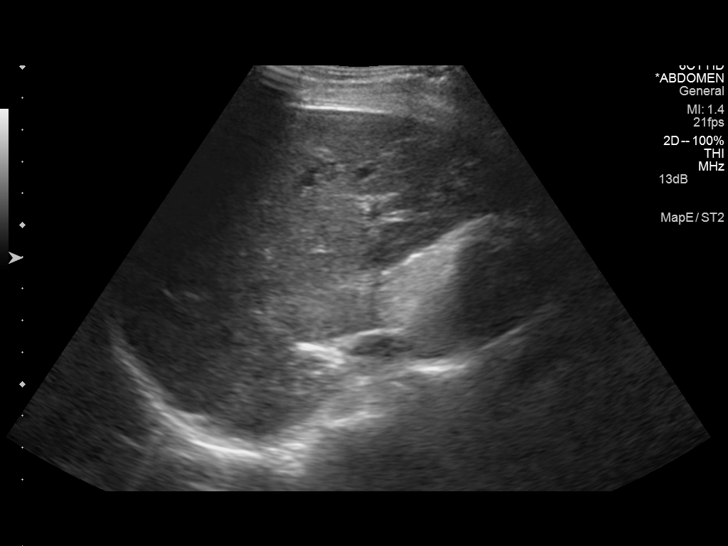
[im 14/38]
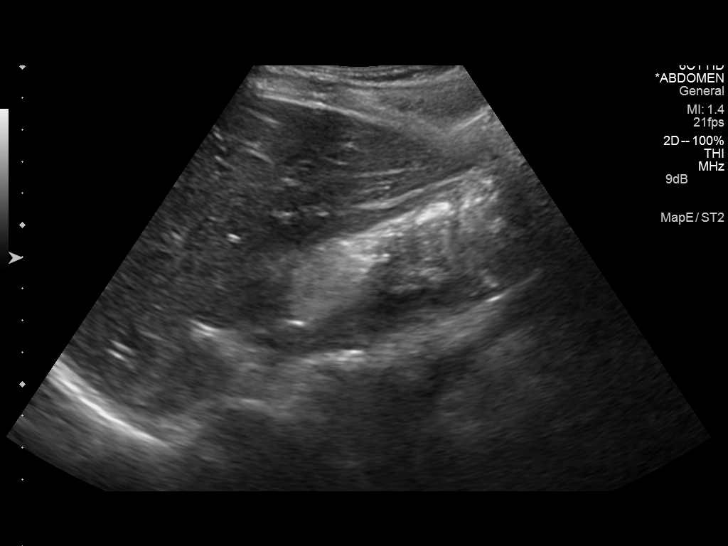
[im 17/38]
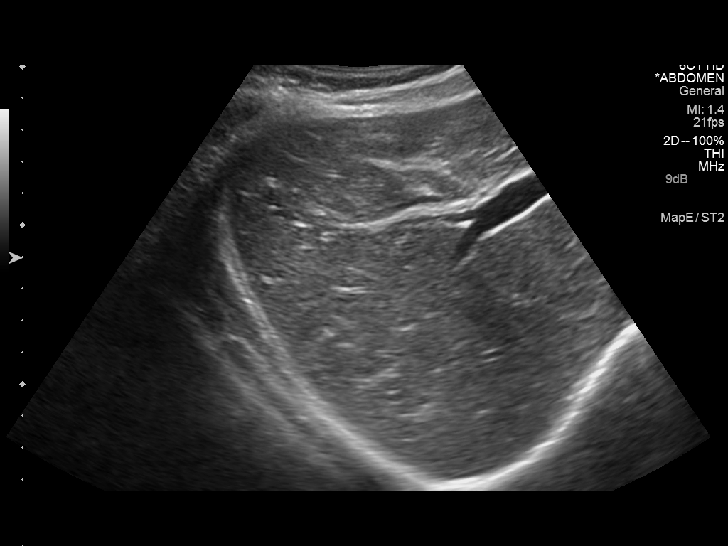
[im 21/38]
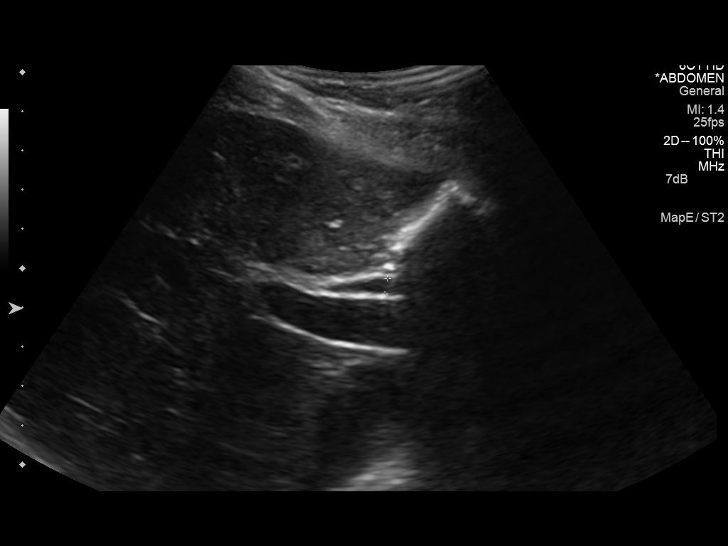
[im 24/38]
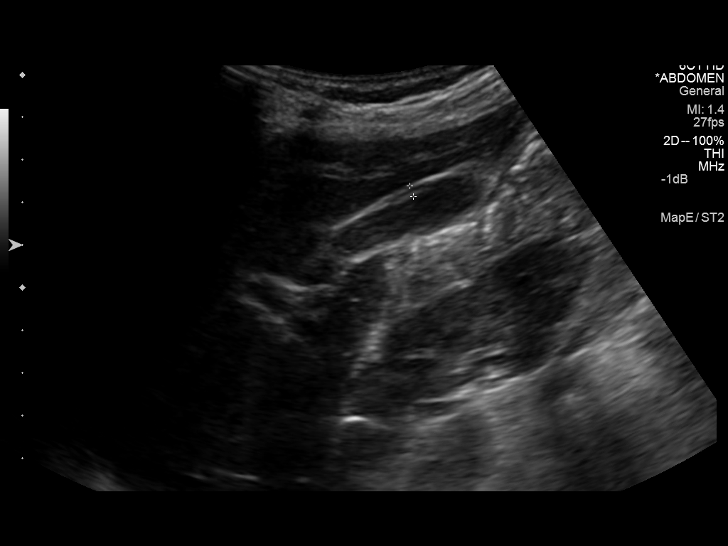
[im 25/38]
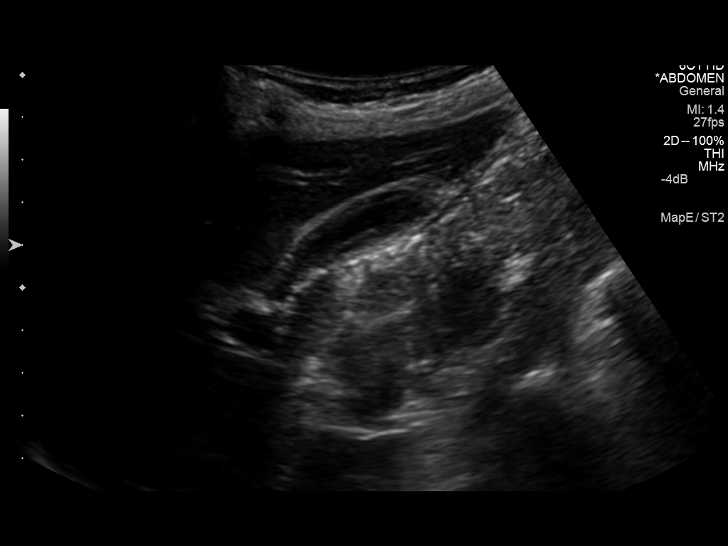
[im 28/38]
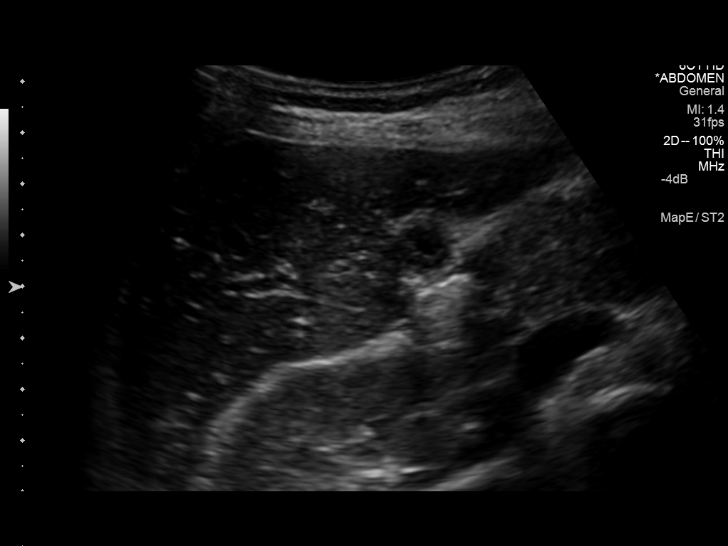
[im 31/38]
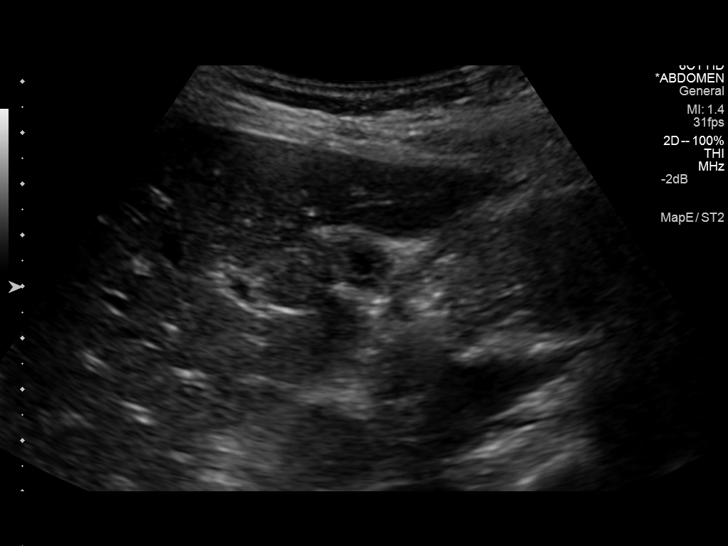
[im 34/38]
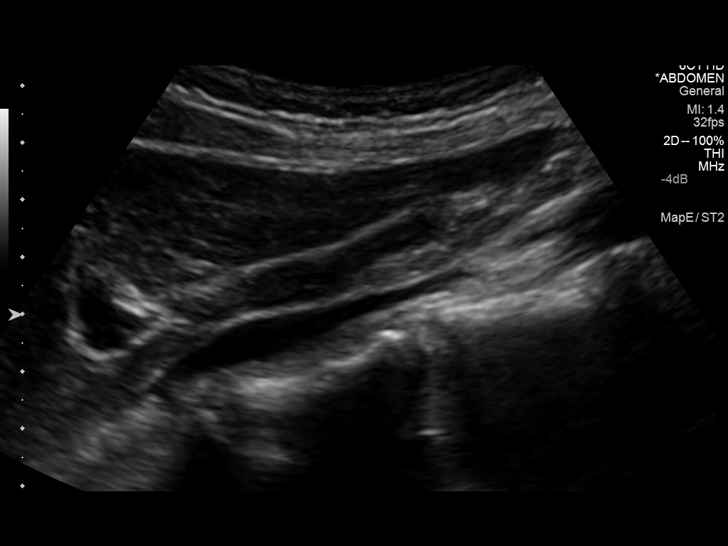
[im 38/38]
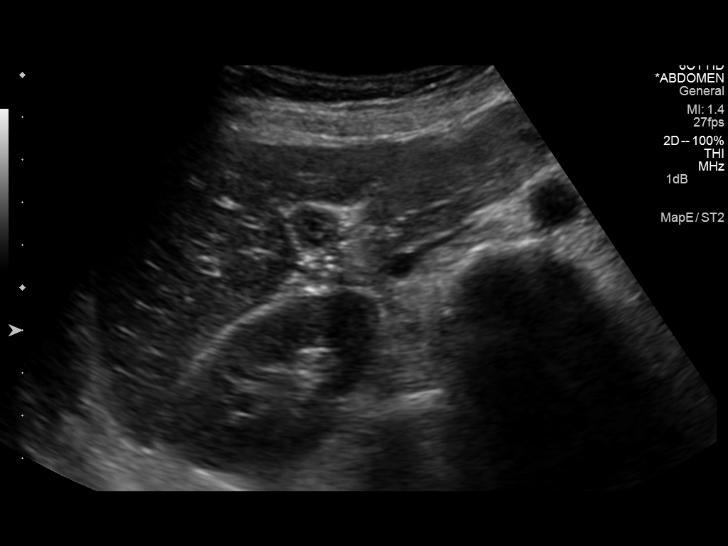

[14 of 25 positions shown; findings below may reference images not displayed]

FINDINGS: Gallbladder:

Contracted; the patient ate right before the exam. No gallstones or
wall thickening visualized. No sonographic Murphy sign noted.

Common bile duct:

Diameter: 0.4 cm, within normal limits in caliber.

Liver:

No focal lesion identified. Within normal limits in parenchymal
echogenicity.
IMPRESSION: Unremarkable ultrasound of the right upper quadrant.

## 2014-10-09 ENCOUNTER — Telehealth: Payer: Self-pay | Admitting: Obstetrics & Gynecology

## 2014-10-09 NOTE — Telephone Encounter (Signed)
Left message for patient to call back so we can move her appointment up to earlier in the day with Dr. Hyacinth MeekerMiller.

## 2014-10-10 ENCOUNTER — Ambulatory Visit: Payer: Managed Care, Other (non HMO) | Admitting: Obstetrics & Gynecology

## 2014-10-10 ENCOUNTER — Encounter: Payer: Self-pay | Admitting: Obstetrics & Gynecology

## 2014-10-27 ENCOUNTER — Encounter: Payer: Self-pay | Admitting: Obstetrics & Gynecology

## 2014-10-30 ENCOUNTER — Encounter (HOSPITAL_COMMUNITY): Payer: Self-pay | Admitting: Internal Medicine

## 2014-12-15 ENCOUNTER — Encounter: Payer: Self-pay | Admitting: Obstetrics & Gynecology

## 2015-01-27 ENCOUNTER — Encounter (HOSPITAL_COMMUNITY): Payer: Self-pay | Admitting: Emergency Medicine

## 2015-01-27 ENCOUNTER — Observation Stay (HOSPITAL_COMMUNITY)
Admission: EM | Admit: 2015-01-27 | Discharge: 2015-01-29 | Disposition: A | Discharge status: Home / Self Care | Payer: Self-pay | Attending: Internal Medicine | Admitting: Internal Medicine

## 2015-01-27 DIAGNOSIS — E039 Hypothyroidism, unspecified: Secondary | ICD-10-CM | POA: Insufficient documentation

## 2015-01-27 DIAGNOSIS — E0500 Thyrotoxicosis with diffuse goiter without thyrotoxic crisis or storm: Principal | ICD-10-CM | POA: Diagnosis present

## 2015-01-27 DIAGNOSIS — I10 Essential (primary) hypertension: Secondary | ICD-10-CM | POA: Diagnosis present

## 2015-01-27 DIAGNOSIS — F419 Anxiety disorder, unspecified: Secondary | ICD-10-CM | POA: Insufficient documentation

## 2015-01-27 DIAGNOSIS — M7989 Other specified soft tissue disorders: Secondary | ICD-10-CM

## 2015-01-27 DIAGNOSIS — E0590 Thyrotoxicosis, unspecified without thyrotoxic crisis or storm: Secondary | ICD-10-CM | POA: Diagnosis present

## 2015-01-27 LAB — COMPREHENSIVE METABOLIC PANEL
ALK PHOS: 158 U/L — AB (ref 39–117)
ALT: 27 U/L (ref 0–35)
AST: 37 U/L (ref 0–37)
Albumin: 3.1 g/dL — ABNORMAL LOW (ref 3.5–5.2)
Anion gap: 7 (ref 5–15)
BUN: <5 mg/dL — ABNORMAL LOW (ref 6–23)
CO2: 22 mmol/L (ref 19–32)
Calcium: 9.3 mg/dL (ref 8.4–10.5)
Chloride: 110 mmol/L (ref 96–112)
Creatinine, Ser: 0.39 mg/dL — ABNORMAL LOW (ref 0.50–1.10)
GFR calc non Af Amer: >90 mL/min (ref >90)
Glucose, Bld: 103 mg/dL — ABNORMAL HIGH (ref 70–99)
Potassium: 3.7 mmol/L (ref 3.5–5.1)
SODIUM: 139 mmol/L (ref 135–145)
Total Bilirubin: 0.5 mg/dL (ref 0.3–1.2)
Total Protein: 6.3 g/dL (ref 6.0–8.3)

## 2015-01-27 LAB — CBC
HCT: 34.1 % — ABNORMAL LOW (ref 36.0–46.0)
HEMOGLOBIN: 11.7 g/dL — AB (ref 12.0–15.0)
MCH: 25.7 pg — AB (ref 26.0–34.0)
MCHC: 34.3 g/dL (ref 30.0–36.0)
MCV: 74.8 fL — ABNORMAL LOW (ref 78.0–100.0)
Platelets: 259 10*3/uL (ref 150–400)
RBC: 4.56 MIL/uL (ref 3.87–5.11)
RDW: 16.6 % — ABNORMAL HIGH (ref 11.5–15.5)
WBC: 6 10*3/uL (ref 4.0–10.5)

## 2015-01-27 LAB — CLOSTRIDIUM DIFFICILE BY PCR: Toxigenic C. Difficile by PCR: NEGATIVE

## 2015-01-27 LAB — RAPID URINE DRUG SCREEN, HOSP PERFORMED
AMPHETAMINES: NOT DETECTED
BARBITURATES: NOT DETECTED
Benzodiazepines: NOT DETECTED
COCAINE: NOT DETECTED
OPIATES: NOT DETECTED
TETRAHYDROCANNABINOL: POSITIVE — AB

## 2015-01-27 LAB — PREGNANCY, URINE: Preg Test, Ur: NEGATIVE

## 2015-01-27 LAB — TSH: TSH: 0.026 u[IU]/mL — AB (ref 0.350–4.500)

## 2015-01-27 LAB — LIPASE, BLOOD: LIPASE: 27 U/L (ref 11–59)

## 2015-01-27 LAB — I-STAT CG4 LACTIC ACID, ED: LACTIC ACID, VENOUS: 1.83 mmol/L (ref 0.5–2.0)

## 2015-01-27 MED ORDER — ALPRAZOLAM 0.25 MG PO TABS
0.2500 mg | ORAL_TABLET | Freq: Once | ORAL | Status: AC
  Administered 2015-01-27: 0.25 mg via ORAL
  Filled 2015-01-27: qty 1

## 2015-01-27 MED ORDER — INFLUENZA VAC SPLIT QUAD 0.5 ML IM SUSY
0.5000 mL | PREFILLED_SYRINGE | INTRAMUSCULAR | Status: AC
  Administered 2015-01-29: 0.5 mL via INTRAMUSCULAR
  Filled 2015-01-27: qty 0.5

## 2015-01-27 MED ORDER — ENOXAPARIN SODIUM 40 MG/0.4ML ~~LOC~~ SOLN
40.0000 mg | SUBCUTANEOUS | Status: DC
  Filled 2015-01-27 (×3): qty 0.4

## 2015-01-27 MED ORDER — ALPRAZOLAM 0.25 MG PO TABS
0.2500 mg | ORAL_TABLET | Freq: Two times a day (BID) | ORAL | Status: DC | PRN
  Administered 2015-01-27 – 2015-01-29 (×3): 0.25 mg via ORAL
  Filled 2015-01-27 (×3): qty 1

## 2015-01-27 MED ORDER — ALBUTEROL SULFATE (2.5 MG/3ML) 0.083% IN NEBU: 2.5000 mg | INHALATION_SOLUTION | RESPIRATORY_TRACT | Status: DC | PRN

## 2015-01-27 MED ORDER — ONDANSETRON HCL 4 MG/2ML IJ SOLN: 4.0000 mg | Freq: Four times a day (QID) | INTRAMUSCULAR | Status: DC | PRN

## 2015-01-27 MED ORDER — ALUM & MAG HYDROXIDE-SIMETH 200-200-20 MG/5ML PO SUSP: 30.0000 mL | Freq: Four times a day (QID) | ORAL | Status: DC | PRN

## 2015-01-27 MED ORDER — GUAIFENESIN-DM 100-10 MG/5ML PO SYRP
5.0000 mL | ORAL_SOLUTION | ORAL | Status: DC | PRN
  Filled 2015-01-27: qty 5

## 2015-01-27 MED ORDER — ATENOLOL 25 MG PO TABS: 25.0000 mg | ORAL_TABLET | Freq: Once | ORAL | Status: DC

## 2015-01-27 MED ORDER — METHIMAZOLE 10 MG PO TABS: 30.0000 mg | ORAL_TABLET | Freq: Every day | ORAL | Status: DC

## 2015-01-27 MED ORDER — WHITE PETROLATUM GEL
Status: AC
  Administered 2015-01-27: 0.2
  Filled 2015-01-27: qty 1

## 2015-01-27 MED ORDER — METOPROLOL TARTRATE 25 MG PO TABS
25.0000 mg | ORAL_TABLET | Freq: Two times a day (BID) | ORAL | Status: DC
  Administered 2015-01-27 – 2015-01-29 (×5): 25 mg via ORAL
  Filled 2015-01-27 (×6): qty 1

## 2015-01-27 MED ORDER — ONDANSETRON HCL 4 MG PO TABS: 4.0000 mg | ORAL_TABLET | Freq: Four times a day (QID) | ORAL | Status: DC | PRN

## 2015-01-27 MED ORDER — SODIUM CHLORIDE 0.9 % IV SOLN
INTRAVENOUS | Status: DC
  Administered 2015-01-27: 50 mL/h via INTRAVENOUS

## 2015-01-27 MED ORDER — ACETAMINOPHEN 650 MG RE SUPP: 650.0000 mg | Freq: Four times a day (QID) | RECTAL | Status: DC | PRN

## 2015-01-27 MED ORDER — OXYCODONE HCL 5 MG PO TABS
5.0000 mg | ORAL_TABLET | ORAL | Status: DC | PRN
  Administered 2015-01-28: 5 mg via ORAL
  Filled 2015-01-27: qty 1

## 2015-01-27 MED ORDER — ENSURE COMPLETE PO LIQD
237.0000 mL | Freq: Two times a day (BID) | ORAL | Status: DC
  Administered 2015-01-27 – 2015-01-29 (×3): 237 mL via ORAL

## 2015-01-27 MED ORDER — METHIMAZOLE 10 MG PO TABS
10.0000 mg | ORAL_TABLET | Freq: Three times a day (TID) | ORAL | Status: DC
  Administered 2015-01-27 – 2015-01-29 (×6): 10 mg via ORAL
  Filled 2015-01-27 (×9): qty 1

## 2015-01-27 MED ORDER — ACETAMINOPHEN 325 MG PO TABS: 650.0000 mg | ORAL_TABLET | Freq: Four times a day (QID) | ORAL | Status: DC | PRN

## 2015-01-27 MED ORDER — ASPIRIN EC 81 MG PO TBEC
81.0000 mg | DELAYED_RELEASE_TABLET | Freq: Every day | ORAL | Status: DC
  Administered 2015-01-27 – 2015-01-29 (×3): 81 mg via ORAL
  Filled 2015-01-27 (×3): qty 1

## 2015-01-27 NOTE — ED Notes (Signed)
Pt. Stated, I've been having diarrhea, fast heart rate, and tremors. Im out of my medicine so Im not sure if that's whats causing it or not. Atenol and Methmazole.

## 2015-01-27 NOTE — ED Provider Notes (Signed)
CSN: [id number]     Arrival date & time 01/27/15  [id number] History   First MD Initiated Contact with Patient 01/27/15 [phone]     Chief Complaint  Patient presents with  . Tachycardia  . Diarrhea     (Consider location/radiation/quality/duration/timing/severity/associated sxs/prior Treatment) HPI Comments: Out of hyperthyroid medications for 2 weeks Symptoms started 1 week ago Feels like eyes are dry, mild dehydration in the morning  Patient is a 44 y.o. female presenting with diarrhea. The history is provided by the patient.  Diarrhea Quality:  Watery and semi-solid Severity:  Severe Onset quality:  Sudden Number of episodes:  >10 per day Duration:  1 week Timing:  Constant Progression:  Worsening Relieved by:  Nothing Worsened by:  Nothing tried Associated symptoms: no abdominal pain, no chills, no fever, no URI and no vomiting   Risk factors: no recent antibiotic use, no sick contacts, no suspicious food intake and no travel to endemic areas     Past Medical History  Diagnosis Date  . Anxiety   . HSV-2 infection   . Hypertension    Past Surgical History  Procedure Laterality Date  . Bartholin gland cyst excision  10/05  . Cesarean section  1993, 1996    x2  . Tubal ligation  1996   Family History  Problem Relation Age of Onset  . Hyperlipidemia Mother   . Hypertension Mother   . Diabetes Father    History  Substance Use Topics  . Smoking status: Never Smoker   . Smokeless tobacco: Never Used  . Alcohol Use: 0.5 oz/week    1 Not specified per week   OB History    Gravida Para Term Preterm AB TAB SAB Ectopic Multiple Living   3 2        2      Review of Systems  Constitutional: Negative for fever and chills.  Respiratory: Negative for cough and shortness of breath.   Gastrointestinal: Positive for diarrhea. Negative for vomiting and abdominal pain.  All other systems reviewed and are negative.     Allergies  Review of patient's allergies indicates no  known allergies.  Home Medications   Prior to Admission medications   Medication Sig Start Date End Date Taking? Authorizing Provider  ALPRAZolam Prudy Feeler(XANAX) 0.25 MG tablet Take 0.25 mg by mouth at bedtime as needed for sleep.   Yes Historical Provider, MD  amLODipine (NORVASC) 2.5 MG tablet Take 2.5 mg by mouth daily.   Yes Historical Provider, MD  atenolol (TENORMIN) 25 MG tablet Take 1 tablet (25 mg total) by mouth daily. 04/26/14  Yes Christiane Haorinna L Sullivan, MD  ibuprofen (ADVIL,MOTRIN) 200 MG tablet Take 800 mg by mouth every 8 (eight) hours as needed for headache.   Yes Historical Provider, MD  methimazole (TAPAZOLE) 10 MG tablet Take 3 tablets (30 mg total) by mouth daily. 04/26/14  Yes Christiane Haorinna L Sullivan, MD  Multiple Vitamin (MULTIVITAMIN) capsule Take 1 capsule by mouth daily.   Yes Historical Provider, MD  zolpidem (AMBIEN) 10 MG tablet Take 10 mg by mouth at bedtime as needed for sleep.   Yes Historical Provider, MD   BP 144/68 mmHg  Pulse 114  Temp(Src) 98.1 F (36.7 C) (Oral)  Resp 18  Ht 5\' 6"  (1.676 m)  Wt 128 lb (58.06 kg)  BMI 20.67 kg/m2  SpO2 100%  LMP 01/02/2015 Physical Exam  Constitutional: She is oriented to person, place, and time. She appears well-developed and well-nourished. No distress.  HENT:  Head:  Normocephalic and atraumatic.  Mouth/Throat: Oropharynx is clear and moist.  Eyes: EOM are normal. Pupils are equal, round, and reactive to light.  Exophthalmos noted  Neck: Normal range of motion. Neck supple. Thyromegaly (bilateral lower neck swelling) present.  Cardiovascular: Regular rhythm.  Tachycardia present.  Exam reveals no friction rub.   No murmur heard. Pulmonary/Chest: Effort normal and breath sounds normal. No respiratory distress. She has no wheezes. She has no rales.  Abdominal: Soft. She exhibits no distension. There is no tenderness. There is no rebound and no guarding.  Musculoskeletal: Normal range of motion. She exhibits no edema.   Neurological: She is alert and oriented to person, place, and time.  Skin: No rash noted. She is not diaphoretic.  Nursing note and vitals reviewed.   ED Course  Procedures (including critical care time) Labs Review Labs Reviewed  TSH - Abnormal; Notable for the following:    TSH 0.026 (*)    All other components within normal limits  CBC - Abnormal; Notable for the following:    Hemoglobin 11.7 (*)    HCT 34.1 (*)    MCV 74.8 (*)    MCH 25.7 (*)    RDW 16.6 (*)    All other components within normal limits  COMPREHENSIVE METABOLIC PANEL - Abnormal; Notable for the following:    Glucose, Bld 103 (*)    BUN <5 (*)    Creatinine, Ser 0.39 (*)    Albumin 3.1 (*)    Alkaline Phosphatase 158 (*)    All other components within normal limits  URINE RAPID DRUG SCREEN (HOSP PERFORMED) - Abnormal; Notable for the following:    Tetrahydrocannabinol POSITIVE (*)    All other components within normal limits  CLOSTRIDIUM DIFFICILE BY PCR  LIPASE, BLOOD  PREGNANCY, URINE  T4, FREE  HIV ANTIBODY (ROUTINE TESTING)  I-STAT CG4 LACTIC ACID, ED    Imaging Review No results found.   EKG Interpretation   Date/Time:  Saturday January 27 2015 07:26:48 EST Ventricular Rate:  111 PR Interval:  136 QRS Duration: 68 QT Interval:  316 QTC Calculation: 429 R Axis:   104 Text Interpretation:  Sinus tachycardia Possible Left atrial enlargement  Rightward axis Borderline ECG No significant change since last tracing  Confirmed by Gwendolyn Grant  MD, Belkis Norbeck ([id number]) on 01/27/2015 3:41:51 PM      MDM   Final diagnoses:  Hyperthyroidism    32F here with jitteriness, diarrhea, agitation. Off of her hyperthyroid medications for past 2 weeks due to financial constraints. Afebrile, tachycardic here. Noticeable tremor. Concern for hyperthyroidism since she's been off her methimazole and atenolol. Labs sent, lytes normal. Still feeling bad. Admitted for observation.    Elwin Mocha, MD 01/27/15  [phone]

## 2015-01-27 NOTE — Progress Notes (Signed)
Per ED staff, patient has recently lost her job and has not been able to afford her thyroid medications. Has not taken her meds in more than 2 weeks. Patient is being admitted as obs patient for now. Unit CM to follow up for discharge needs.

## 2015-01-27 NOTE — Progress Notes (Signed)
CSW consulted with attending.  Patient has recently lost her job and insurance and is out of her thyroid medications.  Attending states patient may need hospitalization.  CSW will consult with Nurse CM in case patient is released on outpatient basis about obtaining needed medications.  [facility] Donna Ramone Macy MisLCSW,LCAS [place] ED CSW [phone]

## 2015-01-27 NOTE — Progress Notes (Signed)
Donna MannersMarlo Ray [id number] Admission Data: 01/27/2015 1:46 PM Attending Provider: Maretta BeesShanker M Ghimire, MD  WUJ:[id number]:NNODI, ADAKU, MD Consults/ Treatment Team:    Donna MannersMarlo Hudlow is a 44 y.o. female patient admitted from ED awake, alert  & orientated  X 3,  Full Code, VSS - Blood pressure 144/68, pulse 114, temperature 98.1 F (36.7 C), temperature source Oral, resp. rate 18, height 5\' 6"  (1.676 m), weight 58.06 kg (128 lb), last menstrual period 01/02/2015, SpO2 100 %., no c/o shortness of breath, no c/o chest pain, no distress noted.    IV site WDL:  Right forearm, running normal saline at 2750ml/hour. Allergies:  No Known Allergies   Past Medical History  Diagnosis Date  . Anxiety   . HSV-2 infection   . Hypertension     Pt orientation to unit, room and routine. Information packet given to patient/family and safety video watched.  Admission INP armband ID verified with patient/family, and in place. SR up x 2, fall risk assessment complete with Patient and family verbalizing understanding of risks associated with falls. Pt verbalizes an understanding of how to use the call bell and to call for help before getting out of bed.  Skin, clean-dry- intact without evidence of bruising, or skin tears.   No evidence of skin break down noted on exam.     Will cont to monitor and assist as needed.  Kern ReapBrumagin, Leilan Bochenek L, RN 01/27/2015 1:46 PM

## 2015-01-27 NOTE — H&P (Signed)
PATIENT DETAILS Name: Donna Ray Age: 44 y.o. Sex: female Date of Birth: 1971/04/20 Admit Date: 01/27/2015 ZOX:WRUEA, ADAKU, MD   CHIEF COMPLAINT:  Worsening tremulousness, diarrhea, neck swelling, anxiety for the past 1 month  HPI: Donna Ray is a 44 y.o. female with a Past Medical History of hypothyroidism who ran out of her medications approximately a month ago due to insurance issues presents today with the above noted complaint. per patient, she was diagnosed with hyperthyroidism approximately a year ago, on discharge was told to follow-up with endocrinology, unfortunately she lost her insurance in June 2015 and has since then not seen her PCP, she also never followed up with endocrinology. Initially she claims she ran out of her medications 2 weeks ago, but upon further questioning, it appears this may have occurred at least a month ago if not more. She presents today with worsening tremors, diarrhea, anxiety, and claims that her goiter has increased as well. She otherwise has no complaints. She denies chest pain, headache, fever, recent travel, shortness of breath or any leg swelling.  ALLERGIES:  No Known Allergies  PAST MEDICAL HISTORY: Past Medical History  Diagnosis Date  . Anxiety   . HSV-2 infection   . Hypertension     PAST SURGICAL HISTORY: Past Surgical History  Procedure Laterality Date  . Bartholin gland cyst excision  10/05  . Cesarean section  1993, 1996    x2  . Tubal ligation  1996    MEDICATIONS AT HOME: Prior to Admission medications   Medication Sig Start Date End Date Taking? Authorizing Provider  ALPRAZolam Prudy Feeler) 0.25 MG tablet Take 0.25 mg by mouth at bedtime as needed for sleep.   Yes Historical Provider, MD  amLODipine (NORVASC) 2.5 MG tablet Take 2.5 mg by mouth daily.   Yes Historical Provider, MD  atenolol (TENORMIN) 25 MG tablet Take 1 tablet (25 mg total) by mouth daily. 04/26/14  Yes Christiane Ha, MD  ibuprofen  (ADVIL,MOTRIN) 200 MG tablet Take 800 mg by mouth every 8 (eight) hours as needed for headache.   Yes Historical Provider, MD  methimazole (TAPAZOLE) 10 MG tablet Take 3 tablets (30 mg total) by mouth daily. 04/26/14  Yes Christiane Ha, MD  Multiple Vitamin (MULTIVITAMIN) capsule Take 1 capsule by mouth daily.   Yes Historical Provider, MD  zolpidem (AMBIEN) 10 MG tablet Take 10 mg by mouth at bedtime as needed for sleep.   Yes Historical Provider, MD    FAMILY HISTORY: Family History  Problem Relation Age of Onset  . Hyperlipidemia Mother   . Hypertension Mother   . Diabetes Father     SOCIAL HISTORY:  reports that she has never smoked. She has never used smokeless tobacco. She reports that she drinks about 0.5 oz of alcohol per week. She reports that she does not use illicit drugs.  REVIEW OF SYSTEMS:  Constitutional:   No  weight loss, night sweats,  Fevers, chills, fatigue.  HEENT:    No headaches, Difficulty swallowing,Tooth/dental problems,Sore throat  Cardio-vascular: No chest pain,  Orthopnea, PND, swelling in lower extremities, anasarca,  dizziness, palpitations  GI:  No heartburn, indigestion, abdominal pain, nausea, vomiting, diarrhea, change in  bowel habits, loss of appetite  Resp: No shortness of breath with exertion or at rest.  No excess mucus, no productive cough, No non-productive cough  Skin:  no rash or lesions.  GU:  no dysuria, change in color of urine, no urgency or frequency.  No flank pain.  Musculoskeletal: No joint pain or swelling.  No decreased range of motion.  No back pain.  Psych: No change in mood or affect. No depression or anxiety.  No memory loss.   PHYSICAL EXAM: Blood pressure 146/64, pulse 120, temperature 98.3 F (36.8 C), temperature source Oral, resp. rate 24, height 5\' 6"  (1.676 m), weight 58.06 kg (128 lb), last menstrual period 01/02/2015, SpO2 96 %.  General appearance :Awake, alert, not in any distress. Speech Clear.  Not toxic Looking HEENT: Atraumatic and Normocephalic, pupils equally reactive to light and accomodation Neck: supple, no JVD. No cervical lymphadenopathy.  Chest:Good air entry bilaterally, no added sounds  CVS: S1 S2, tachycardic GI: Bowel sounds present, Non tender and not distended with no gaurding, rigidity or rebound. Extremities: B/L Lower Ext shows no edema, both legs are warm to touch Neurology: Awake alert, and oriented X 3, CN II-XII intact, Non focal Skin:No Rash Wounds:N/A  LABS ON ADMISSION:   Recent Labs  01/27/15 0815  NA 139  K 3.7  CL 110  CO2 22  GLUCOSE 103*  BUN <5*  CREATININE 0.39*  CALCIUM 9.3    Recent Labs  01/27/15 0815  AST 37  ALT 27  ALKPHOS 158*  BILITOT 0.5  PROT 6.3  ALBUMIN 3.1*    Recent Labs  01/27/15 0815  LIPASE 27    Recent Labs  01/27/15 0815  WBC 6.0  HGB 11.7*  HCT 34.1*  MCV 74.8*  PLT 259   No results for input(s): CKTOTAL, CKMB, CKMBINDEX, TROPONINI in the last 72 hours. No results for input(s): DDIMER in the last 72 hours. Invalid input(s): POCBNP   RADIOLOGIC STUDIES ON ADMISSION: No results found.   EKG: Independently reviewed.  sinus tachycardia   ASSESSMENT AND PLAN: Present on Admission:  . Thyrotoxicosis: 44 year old lady with known history of hyperthyroidism (likely Graves' disease as TSI significantly elevated) not taking her medications for at least 1 month presenting with tremors, tachycardia, diarrhea, restlessness. Symptomatology consistent with thyrotoxicosis. TSH decreased, free T4 pending. Will start Tapazole (after pregnancy test negative), and metoprolol. Suspect she has been off her medications for much longer than a month, check an echocardiogram as well. I have counseled her extensively that she needs to follow-up with an endocrinologist and her PCP (per patient he see peanut aware of diagnosis of hyperthyroidism). She will need evaluation by case management prior to discharge for  medication assistance and also for PCP assistance. LFTs will need to be monitored while on Tapazole.   Marland Kitchen. HTN (hypertension): Will stop amlodipine, and titrate metoprolol accordingly.   Further plan will depend as patient's clinical course evolves and further radiologic and laboratory data become available. Patient will be monitored closely.  Above noted plan was discussed with patient, she was in agreement.   DVT Prophylaxis: Prophylactic Lovenox  Code Status: Full Code  Disposition Plan: home when stable   Total time spent for admission equals 45 minutes.  [facility] TreatmentGHIMIRE,Kashif Pooler [facility] Pager [phone]  If 7PM-7AM, please contact night-coverage [email or web address] Password [email or web address] 01/27/2015, 11:01 AM

## 2015-01-27 NOTE — Progress Notes (Signed)
VASCULAR LAB PRELIMINARY  PRELIMINARY  PRELIMINARY  PRELIMINARY  Bilateral lower extremity venous Dopplers completed.    Preliminary report:  There is no DVT or SVT noted in the bilateral lower extremities.   Nehemyah Foushee, RVT 01/27/2015, 5:28 PM

## 2015-01-27 NOTE — ED Notes (Signed)
Pt is out of thyroid medications and has been having diarrhea for one week along with tremors, thirst, and dry eyes.

## 2015-01-28 DIAGNOSIS — R000 Tachycardia, unspecified: Secondary | ICD-10-CM

## 2015-01-28 DIAGNOSIS — E0590 Thyrotoxicosis, unspecified without thyrotoxic crisis or storm: Secondary | ICD-10-CM

## 2015-01-28 LAB — BASIC METABOLIC PANEL
ANION GAP: 7 (ref 5–15)
BUN: 7 mg/dL (ref 6–23)
CHLORIDE: 110 mmol/L (ref 96–112)
CO2: 21 mmol/L (ref 19–32)
Calcium: 9.4 mg/dL (ref 8.4–10.5)
Creatinine, Ser: 0.39 mg/dL — ABNORMAL LOW (ref 0.50–1.10)
GFR calc Af Amer: >90 mL/min (ref >90)
GFR calc non Af Amer: >90 mL/min (ref >90)
Glucose, Bld: 106 mg/dL — ABNORMAL HIGH (ref 70–99)
Potassium: 3.7 mmol/L (ref 3.5–5.1)
Sodium: 138 mmol/L (ref 135–145)

## 2015-01-28 LAB — CBC
HCT: 33.2 % — ABNORMAL LOW (ref 36.0–46.0)
HEMOGLOBIN: 11.3 g/dL — AB (ref 12.0–15.0)
MCH: 25.7 pg — AB (ref 26.0–34.0)
MCHC: 34 g/dL (ref 30.0–36.0)
MCV: 75.5 fL — AB (ref 78.0–100.0)
PLATELETS: 254 10*3/uL (ref 150–400)
RBC: 4.4 MIL/uL (ref 3.87–5.11)
RDW: 16.3 % — ABNORMAL HIGH (ref 11.5–15.5)
WBC: 6.3 10*3/uL (ref 4.0–10.5)

## 2015-01-28 MED ORDER — SODIUM CHLORIDE 0.9 % IV SOLN
INTRAVENOUS | Status: DC
  Administered 2015-01-28 (×2): via INTRAVENOUS

## 2015-01-28 MED ORDER — LOPERAMIDE HCL 2 MG PO CAPS
2.0000 mg | ORAL_CAPSULE | ORAL | Status: DC | PRN
  Administered 2015-01-28 (×2): 2 mg via ORAL
  Filled 2015-01-28 (×2): qty 1

## 2015-01-28 NOTE — Progress Notes (Signed)
UR completed 

## 2015-01-28 NOTE — Progress Notes (Signed)
[facility] PROGRESS NOTE  Donna MannersMarlo Ray RUE:[id number] DOB: 08/31/1971 DOA: 01/27/2015 PCP: Donna AcreNNODI, ADAKU, MD  Assessment/Plan: 1-Thyrotoxicosis;  Continue with IV fluids, methimazole and metoprolol.  TSH at 0.026. Free T 3 and T 4 pending.   2-Tachycardia; ECHO pending.  Started on metoprolol.   3-HTN; continue with metoprolol.   4-Diarrhea; from hyperthyroidism. c diff negative,.   5-Screening for HIV pending.   Code Status: full code/  Family Communication: care discussed with patient Disposition Plan: home in 24 hours. CM consulted , patient need PCP and assistance with medications.    Consultants:  none  Procedures:  ECHO pending.   Antibiotics:  none  HPI/Subjective: Alert in no distress. She is anxious. Diarrhea better after imodium.    Objective: Filed Vitals:   01/28/15 0528  BP: 118/64  Pulse: 116  Temp: 98 F (36.7 C)  Resp: 16    Intake/Output Summary (Last 24 hours) at 01/28/15 0920 Last data filed at 01/27/15 1840  Gross per 24 hour  Intake 488.67 ml  Output      0 ml  Net 488.67 ml   Filed Weights   01/27/15 0746  Weight: 58.06 kg (128 lb)    Exam:   General:  Anxious, trimerous,   Cardiovascular: S 1, S 2 RRR  Respiratory: CTA  Abdomen: BS present, soft, no tenderness.   Musculoskeletal: no edema.   Data Reviewed: Basic Metabolic Panel:  Recent Labs Lab 01/27/15 0815 01/28/15 0415  NA 139 138  K 3.7 3.7  CL 110 110  CO2 22 21  GLUCOSE 103* 106*  BUN <5* 7  CREATININE 0.39* 0.39*  CALCIUM 9.3 9.4   Liver Function Tests:  Recent Labs Lab 01/27/15 0815  AST 37  ALT 27  ALKPHOS 158*  BILITOT 0.5  PROT 6.3  ALBUMIN 3.1*    Recent Labs Lab 01/27/15 0815  LIPASE 27   No results for input(s): AMMONIA in the last 168 hours. CBC:  Recent Labs Lab 01/27/15 0815 01/28/15 0415  WBC 6.0 6.3  HGB 11.7* 11.3*  HCT 34.1* 33.2*  MCV 74.8* 75.5*  PLT 259 254   Cardiac Enzymes: No results for  input(s): CKTOTAL, CKMB, CKMBINDEX, TROPONINI in the last 168 hours. BNP (last 3 results) No results for input(s): PROBNP in the last 8760 hours. CBG: No results for input(s): GLUCAP in the last 168 hours.  Recent Results (from the past 240 hour(s))  Clostridium Difficile by PCR     Status: None   Collection Time: 01/27/15  2:57 PM  Result Value Ref Range Status   C difficile by pcr NEGATIVE NEGATIVE Final     Studies: No results found.  Scheduled Meds: . aspirin EC  81 mg Oral Daily  . enoxaparin (LOVENOX) injection  40 mg Subcutaneous Q24H  . feeding supplement (ENSURE COMPLETE)  237 mL Oral BID BM  . Influenza vac split quadrivalent PF  0.5 mL Intramuscular Tomorrow-1000  . methimazole  10 mg Oral TID  . metoprolol tartrate  25 mg Oral BID   Continuous Infusions: . sodium chloride      Principal Problem:   Thyrotoxicosis Active Problems:   Hyperthyroidism   HTN (hypertension)    Time spent: 35 minutes.     Hartley Barefootegalado, Karl Erway A  [facility] Pager [phone]. If 7PM-7AM, please contact night-coverage at [email or web address], password [email or web address] 01/28/2015, 9:20 AM  LOS: 1 day

## 2015-01-28 NOTE — Progress Notes (Signed)
  Echocardiogram 2D Echocardiogram has been performed.  Donna EvertsRix, [name] A 01/28/2015, 3:51 PM

## 2015-01-29 LAB — T3, FREE: T3 FREE: 32.6 pg/mL — AB (ref 2.0–4.4)

## 2015-01-29 LAB — T4, FREE

## 2015-01-29 MED ORDER — ENSURE COMPLETE PO LIQD: 237.0000 mL | Freq: Two times a day (BID) | ORAL | Status: DC

## 2015-01-29 MED ORDER — METHIMAZOLE 10 MG PO TABS: 10.0000 mg | ORAL_TABLET | Freq: Three times a day (TID) | ORAL | Status: DC

## 2015-01-29 MED ORDER — ALPRAZOLAM 0.25 MG PO TABS: 0.2500 mg | ORAL_TABLET | Freq: Every evening | ORAL | Status: DC | PRN

## 2015-01-29 MED ORDER — METOPROLOL TARTRATE 25 MG PO TABS: 25.0000 mg | ORAL_TABLET | Freq: Two times a day (BID) | ORAL | Status: DC

## 2015-01-29 MED ORDER — ASPIRIN 81 MG PO TBEC: 81.0000 mg | DELAYED_RELEASE_TABLET | Freq: Every day | ORAL | Status: AC

## 2015-01-29 MED ORDER — LOPERAMIDE HCL 2 MG PO CAPS: 2.0000 mg | ORAL_CAPSULE | ORAL | Status: DC | PRN

## 2015-01-29 NOTE — Care Management Note (Signed)
    Page 1 of 1   01/29/2015     7:11:44 PM CARE MANAGEMENT NOTE 01/29/2015  Patient:  Donna Ray,Donna Ray   Account Number:  [id number]  Date Initiated:  01/29/2015  Documentation initiated by:  Letha CapeAYLOR,Raziyah Vanvleck  Subjective/Objective Assessment:   dx thyrotoxicosis  admit- from home.     Action/Plan:   pt eval- rec hhpt- pt refusing.   Anticipated DC Date:  01/29/2015   Anticipated DC Plan:  HOME W HOME HEALTH SERVICES      DC Planning Services  CM consult  Follow-up appt scheduled  Medication Assistance      Choice offered to / List presented to:  C-1 Patient   DME arranged  WALKER - Lavone NianOLLING      [facility]     [facility]  [facility]      Status of service:  Completed, signed off Medicare Important Message given?  NO (If response is "NO", the following Medicare IM given date fields will be blank) Date Medicare IM given:   Medicare IM given by:   Date Additional Medicare IM given:   Additional Medicare IM given by:    Discharge Disposition:  HOME/SELF CARE  Per UR Regulation:  Reviewed for med. necessity/level of care/duration of stay  If discussed at Long Length of Stay Meetings, dates discussed:    Comments:  01/29/15 1910 Letha Capeeborah Lamees Gable RN ,BSN [phone] patient refusing hhpt, but she would like to have a rolling walker, NCM faxed referral to [facility], patient to receive rolling walker in her hospital room before dc.

## 2015-01-29 NOTE — Discharge Summary (Signed)
Physician Discharge Summary  Donna Ray ZHY:[id number] DOB: 04-May-1971 DOA: 01/27/2015  PCP: Gretel Acre, MD  Admit date: 01/27/2015 Discharge date: 01/29/2015  Time spent: 35 minutes  Recommendations for Outpatient Follow-up:  1. Please follow result of HIV test, pending at this time.  2. Please refer patient to endocrinologist.  3. Need repeat TSH, Free T 3 and T 4 in 2 to 3 weeks.   Discharge Diagnoses:    Thyrotoxicosis   Hyperthyroidism   HTN (hypertension)   Discharge Condition: stable.   Diet recommendation: heart healthy  Filed Weights   01/27/15 0746  Weight: 58.06 kg (128 lb)    History of present illness:  Donna Ray is a 44 y.o. female with a Past Medical History of hypothyroidism who ran out of her medications approximately a month ago due to insurance issues presents today with the above noted complaint. per patient, she was diagnosed with hyperthyroidism approximately a year ago, on discharge was told to follow-up with endocrinology, unfortunately she lost her insurance in June 2015 and has since then not seen her PCP, she also never followed up with endocrinology. Initially she claims she ran out of her medications 2 weeks ago, but upon further questioning, it appears this may have occurred at least a month ago if not more. She presents today with worsening tremors, diarrhea, anxiety, and claims that her goiter has increased as well. She otherwise has no complaints. She denies chest pain, headache, fever, recent travel, shortness of breath or any leg swelling.  Hospital Course:  1-Thyrotoxicosis: Received  IV fluids, methimazole and metoprolol.  TSH at 0.026. Free T 3 and T 4 more than 12 Need referral to endocrinologist.   2-Tachycardia; ECHO; normal EF, and diastolic function.  on metoprolol.   3-HTN; continue with metoprolol.   4-Diarrhea; from hyperthyroidism. c diff negative,.   5-Screening for HIV pending.   Procedures:  ECHO; - Left  ventricle: The cavity size was normal. Wall thickness was normal. Systolic function was normal. The estimated ejection fraction was in the range of 55% to 60%. Wall motion was normal; there were no regional wall motion abnormalities. - Mitral valve: There was moderate regurgitation. - Right ventricle: The cavity size was mildly dilated. Systolic function was mildly reduced. - Pulmonary arteries: PA peak pressure: 58 mm Hg (S).   Doppler negative for DVT.   Consultations:  none  Discharge Exam: Filed Vitals:   01/29/15 0525  BP: 127/51  Pulse:   Temp: 99.1 F (37.3 C)  Resp: 19    General: Alert in no distress.  Cardiovascular: S 1, S 2 RRR Respiratory: CTA  Discharge Instructions   Discharge Instructions    Diet - low sodium heart healthy    Complete by:  As directed      Increase activity slowly    Complete by:  As directed           Current Discharge Medication List    START taking these medications   Details  aspirin EC 81 MG EC tablet Take 1 tablet (81 mg total) by mouth daily. Qty: 30 tablet, Refills: 0    feeding supplement, ENSURE COMPLETE, (ENSURE COMPLETE) LIQD Take 237 mLs by mouth 2 (two) times daily between meals. Qty: 30 Bottle, Refills: 0    loperamide (IMODIUM) 2 MG capsule Take 1 capsule (2 mg total) by mouth as needed for diarrhea or loose stools. Qty: 30 capsule, Refills: 0    metoprolol tartrate (LOPRESSOR) 25 MG tablet Take 1 tablet (25  mg total) by mouth 2 (two) times daily. Qty: 60 tablet, Refills: 0      CONTINUE these medications which have CHANGED   Details  ALPRAZolam (XANAX) 0.25 MG tablet Take 1 tablet (0.25 mg total) by mouth at bedtime as needed for sleep. Qty: 30 tablet, Refills: 0    methimazole (TAPAZOLE) 10 MG tablet Take 1 tablet (10 mg total) by mouth 3 (three) times daily. Qty: 120 tablet, Refills: 0      STOP taking these medications     amLODipine (NORVASC) 2.5 MG tablet      atenolol (TENORMIN) 25  MG tablet      ibuprofen (ADVIL,MOTRIN) 200 MG tablet      Multiple Vitamin (MULTIVITAMIN) capsule      zolpidem (AMBIEN) 10 MG tablet        No Known Allergies    The results of significant diagnostics from this hospitalization (including imaging, microbiology, ancillary and laboratory) are listed below for reference.    Significant Diagnostic Studies: No results found.  Microbiology: Recent Results (from the past 240 hour(s))  Clostridium Difficile by PCR     Status: None   Collection Time: 01/27/15  2:57 PM  Result Value Ref Range Status   C difficile by pcr NEGATIVE NEGATIVE Final     Labs: Basic Metabolic Panel:  Recent Labs Lab 01/27/15 0815 01/28/15 0415  NA 139 138  K 3.7 3.7  CL 110 110  CO2 22 21  GLUCOSE 103* 106*  BUN <5* 7  CREATININE 0.39* 0.39*  CALCIUM 9.3 9.4   Liver Function Tests:  Recent Labs Lab 01/27/15 0815  AST 37  ALT 27  ALKPHOS 158*  BILITOT 0.5  PROT 6.3  ALBUMIN 3.1*    Recent Labs Lab 01/27/15 0815  LIPASE 27   No results for input(s): AMMONIA in the last 168 hours. CBC:  Recent Labs Lab 01/27/15 0815 01/28/15 0415  WBC 6.0 6.3  HGB 11.7* 11.3*  HCT 34.1* 33.2*  MCV 74.8* 75.5*  PLT 259 254   Cardiac Enzymes: No results for input(s): CKTOTAL, CKMB, CKMBINDEX, TROPONINI in the last 168 hours. BNP: BNP (last 3 results) No results for input(s): PROBNP in the last 8760 hours. CBG: No results for input(s): GLUCAP in the last 168 hours.     SignedHartley Barefoot:  Syeda Prickett A  [facility] 01/29/2015, 8:54 AM

## 2015-01-29 NOTE — Progress Notes (Signed)
Patient discharge teaching given, including activity, diet, follow-up appoints, and medications. Patient verbalized understanding of all discharge instructions. IV access was d/c'd. Vitals are stable. Skin is intact except as charted in most recent assessments. Pt refused to be escorted out but allowed me to walk her to elevators. Patient also aware that an Echo was ordered by Dr. Elease HashimotoNahser even though she had echo done yesterday. She refused to wait to leave until after I had my page returned by Dr. Elease HashimotoNahser to clarify order of echo.

## 2015-01-30 LAB — HIV ANTIBODY (ROUTINE TESTING W REFLEX): HIV SCREEN 4TH GENERATION: NONREACTIVE

## 2015-01-31 ENCOUNTER — Ambulatory Visit: Payer: Self-pay | Attending: Internal Medicine

## 2015-02-05 ENCOUNTER — Ambulatory Visit: Payer: MEDICAID | Attending: Family Medicine | Admitting: Physician Assistant

## 2015-02-05 ENCOUNTER — Encounter: Payer: Self-pay | Admitting: Physician Assistant

## 2015-02-05 VITALS — BP 130/77 | HR 95 | Temp 97.4°F | Resp 18 | Ht 66.0 in | Wt 135.2 lb

## 2015-02-05 DIAGNOSIS — Z7982 Long term (current) use of aspirin: Secondary | ICD-10-CM | POA: Insufficient documentation

## 2015-02-05 DIAGNOSIS — I340 Nonrheumatic mitral (valve) insufficiency: Secondary | ICD-10-CM | POA: Insufficient documentation

## 2015-02-05 DIAGNOSIS — F419 Anxiety disorder, unspecified: Secondary | ICD-10-CM | POA: Insufficient documentation

## 2015-02-05 DIAGNOSIS — E0590 Thyrotoxicosis, unspecified without thyrotoxic crisis or storm: Secondary | ICD-10-CM | POA: Insufficient documentation

## 2015-02-05 DIAGNOSIS — I10 Essential (primary) hypertension: Secondary | ICD-10-CM | POA: Insufficient documentation

## 2015-02-05 DIAGNOSIS — Z79899 Other long term (current) drug therapy: Secondary | ICD-10-CM | POA: Insufficient documentation

## 2015-02-05 MED ORDER — METHIMAZOLE 10 MG PO TABS: 10.0000 mg | ORAL_TABLET | Freq: Three times a day (TID) | ORAL | Status: DC

## 2015-02-05 MED ORDER — METOPROLOL TARTRATE 25 MG PO TABS: 25.0000 mg | ORAL_TABLET | Freq: Two times a day (BID) | ORAL | Status: DC

## 2015-02-05 NOTE — Patient Instructions (Signed)
Your HIV test was negative Come back next week for a repeat thyroid lab Return in one month to see the doctor We will go ahead and place the referral for the endocrine specialist. You can see them when you have your Medicaid.

## 2015-02-05 NOTE — Progress Notes (Signed)
Donna Ray  ZOX:[id number]  [id number]  DOB - 1971/11/21  Chief Complaint  Patient presents with  . Hospitalization Follow-up       Subjective:   Donna Ray is a 44 y.o. female here today for establishment of care. She was hospitalized from 01/27/2015 through 01/29/2015 for thyrotoxicosis. She initially presented with tremors, diarrhea, tachycardia, and anxiety. Her TSH was 0.026. Her T4 was greater than 12. She was started on Tapazole and metoprolol with improvement of her symptoms. Her HIV test was negative. An echocardiogram showed moderate mitral regurgitation. Otherwise her echocardiogram was okay. She has preserved LV function. She was sent here for establishment of care.    Since discharge she has been compliant with her medication regimen. She denies any of chest pain or shortness of breath. She denies tachycardia or palpitations. She denies dizziness or lightheadedness. She denies gait abnormalities. She denies issues with temperature.    ROS: GEN: denies fever or chills, denies change in weight Skin: denies lesions or rashes HEENT: denies headache, earache, epistaxis, sore throat, or neck pain LUNGS: denies SHOB, dyspnea, PND, orthopnea CV: denies CP or palpitations ABD: denies abd pain, N or V EXT: denies muscle spasms or swelling; no pain in lower ext, no weakness NEURO: denies numbness or tingling, denies sz, stroke or TIA  No problems updated.  ALLERGIES: No Known Allergies  PAST MEDICAL HISTORY: Past Medical History  Diagnosis Date  . Anxiety   . HSV-2 infection   . Hypertension     PAST SURGICAL HISTORY: Past Surgical History  Procedure Laterality Date  . Bartholin gland cyst excision  10/05  . Cesarean section  1993, 1996    x2  . Tubal ligation  1996    MEDICATIONS AT HOME: Prior to Admission medications   Medication Sig Start Date End Date Taking? Authorizing Provider  ALPRAZolam (XANAX) 0.25 MG tablet Take 1 tablet (0.25 mg  total) by mouth at bedtime as needed for sleep. 01/29/15   Belkys A Regalado, MD  aspirin EC 81 MG EC tablet Take 1 tablet (81 mg total) by mouth daily. 01/29/15   Belkys A Regalado, MD  feeding supplement, ENSURE COMPLETE, (ENSURE COMPLETE) LIQD Take 237 mLs by mouth 2 (two) times daily between meals. 01/29/15   Belkys A Regalado, MD  loperamide (IMODIUM) 2 MG capsule Take 1 capsule (2 mg total) by mouth as needed for diarrhea or loose stools. 01/29/15   Belkys A Regalado, MD  methimazole (TAPAZOLE) 10 MG tablet Take 1 tablet (10 mg total) by mouth 3 (three) times daily. 01/29/15   Belkys A Regalado, MD  metoprolol tartrate (LOPRESSOR) 25 MG tablet Take 1 tablet (25 mg total) by mouth 2 (two) times daily. 01/29/15   Belkys A Regalado, MD     Objective:   Filed Vitals:   02/05/15 0909  BP: 130/77  Pulse: 95  Temp: 97.4 F (36.3 C)  TempSrc: Oral  Resp: 18  Height:  (1.676 m)  Weight: 135 lb 3.2 oz (61.326 kg)  SpO2: 100%    Exam General appearance : Awake, alert, not in any distress. Speech Clear. Not toxic looking HEENT: Atraumatic and Normocephalic, pupils equally reactive to light and accomodation Neck: supple, no JVD. No cervical lymphadenopathy.  Chest:Good air entry bilaterally, no added sounds  CVS: S1 S2 regular, no murmurs.  Abdomen: Bowel sounds present, Non tender and not distended with no gaurding, rigidity or rebound. Extremities: B/L Lower Ext shows no edema, both legs are warm to touch Neurology:  Awake alert, and oriented X 3, CN II-XII intact, Non focal Skin:No Rash Wounds:N/A  Data Review No results found for: HGBA1C   Assessment & Plan  1. Thyrotoxicosis/hyperthyroidism/Goiter  -Continue Tapazole and metoprolol  -Referral to endocrinologist   2. Moderate mitral regurgitation  -Continue with blood pressure management  -Repeat echocardiogram in approximately 12 months  -Consider Referral to cardiology at some point   Return in about 1 month (around  03/06/2015).  The patient was given clear instructions to go to ER or return to medical center if symptoms don't improve, worsen or new problems develop. The patient verbalized understanding. The patient was told to call to get lab results if they haven't heard anything in the next week.   This note has been created with Education officer, environmentalDragon speech recognition software and smart phrase technology. Any transcriptional errors are unintentional.    Donna Juniffany Kashmere Daywalt, PA-C [facility] [place], [place] [phone]   02/05/2015, 11:00 AM

## 2015-02-05 NOTE — Progress Notes (Signed)
Pt presents to clinic for follow-up. Pt concerns about abnormal valve she was diagnosed for her last admission to the hospital.

## 2015-02-12 ENCOUNTER — Other Ambulatory Visit: Payer: Self-pay

## 2015-02-14 ENCOUNTER — Ambulatory Visit: Payer: Self-pay | Admitting: Internal Medicine

## 2015-02-15 ENCOUNTER — Other Ambulatory Visit: Payer: Self-pay

## 2015-02-19 ENCOUNTER — Other Ambulatory Visit: Payer: Self-pay

## 2015-02-20 ENCOUNTER — Other Ambulatory Visit: Payer: Self-pay

## 2015-02-21 ENCOUNTER — Other Ambulatory Visit: Payer: Self-pay

## 2015-02-28 ENCOUNTER — Other Ambulatory Visit: Payer: Self-pay | Admitting: Family Medicine

## 2015-02-28 NOTE — Telephone Encounter (Signed)
Patient called to request a med refill for ALPRAZolam (XANAX) 0.25 MG tablet. Patient uses [facility] [address] Please f/u with pt.

## 2015-02-28 NOTE — Telephone Encounter (Signed)
Left voice message. Pt needs to establish care with PCP

## 2015-03-02 ENCOUNTER — Other Ambulatory Visit: Payer: Self-pay

## 2015-03-05 ENCOUNTER — Other Ambulatory Visit: Payer: Self-pay | Admitting: Emergency Medicine

## 2015-03-05 MED ORDER — METOPROLOL TARTRATE 25 MG PO TABS: 25.0000 mg | ORAL_TABLET | Freq: Two times a day (BID) | ORAL | Status: DC

## 2015-03-07 ENCOUNTER — Telehealth: Payer: Self-pay | Admitting: Family Medicine

## 2015-03-07 NOTE — Telephone Encounter (Signed)
Patient called to request a med refill for ALPRAZolam (XANAX) 0.25 MG tablet. [address] [facility] [address] Please f/u with pt at [phone].

## 2015-03-09 ENCOUNTER — Telehealth: Payer: Self-pay | Admitting: Family Medicine

## 2015-03-09 NOTE — Telephone Encounter (Signed)
Patient called to request a med refill ALPRAZolam (XANAX) 0.25 MG tablet. Patient states that she has been unable to sleep. Please f/u with pt.

## 2015-03-12 ENCOUNTER — Telehealth: Payer: Self-pay | Admitting: Emergency Medicine

## 2015-03-12 NOTE — Telephone Encounter (Signed)
The numbers listed invalid

## 2015-03-16 ENCOUNTER — Telehealth: Payer: Self-pay | Admitting: Family Medicine

## 2015-03-16 ENCOUNTER — Telehealth: Payer: Self-pay | Admitting: Physician Assistant

## 2015-03-19 ENCOUNTER — Ambulatory Visit: Payer: Self-pay | Attending: Family Medicine | Admitting: Family Medicine

## 2015-03-19 ENCOUNTER — Encounter: Payer: Self-pay | Admitting: Family Medicine

## 2015-03-19 VITALS — BP 120/68 | HR 82 | Temp 98.1°F | Resp 18 | Ht 65.5 in | Wt 135.0 lb

## 2015-03-19 DIAGNOSIS — E0590 Thyrotoxicosis, unspecified without thyrotoxic crisis or storm: Secondary | ICD-10-CM

## 2015-03-19 DIAGNOSIS — G4700 Insomnia, unspecified: Secondary | ICD-10-CM | POA: Insufficient documentation

## 2015-03-19 DIAGNOSIS — E0500 Thyrotoxicosis with diffuse goiter without thyrotoxic crisis or storm: Secondary | ICD-10-CM

## 2015-03-19 MED ORDER — ALPRAZOLAM 0.25 MG PO TABS: 0.2500 mg | ORAL_TABLET | Freq: Every evening | ORAL | Status: DC | PRN

## 2015-03-19 NOTE — Patient Instructions (Addendum)
Donna Ray,  Thank you for coming in today. It was a pleasure meeting you. I look forward to being your primary doctor.  1. Hyperthyroidism: continue your regimen of methimazole and metoprolol Continue xanax for insomnia.  I will placed endocrinology referral after update about medicaid.  If medicaid takes a while Please apply for  discount and orange card, you can also inquire if any of your medications are on the PASS (medications assistance) list.    F/u in 4-5  week for repeat labs, TSH, free T4, T3 F/u with me in 6 weeks for lab review.   Dr. Armen PickupFunches
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
## 2015-03-19 NOTE — Assessment & Plan Note (Signed)
1. Hyperthyroidism: continue your regimen of methimazole and metoprolol Continue xanax for insomnia.  I will placed endocrinology referral after update about medicaid.  If medicaid takes a while Please apply for [place] discount and orange card, you can also inquire if any of your medications are on the PASS (medications assistance) list.

## 2015-03-19 NOTE — Progress Notes (Signed)
Establish Care Hx thyroid problems, Cardio problems

## 2015-03-27 ENCOUNTER — Other Ambulatory Visit: Payer: Self-pay | Admitting: Emergency Medicine

## 2015-03-27 MED ORDER — METHIMAZOLE 10 MG PO TABS: 10.0000 mg | ORAL_TABLET | Freq: Three times a day (TID) | ORAL | Status: DC

## 2015-04-16 ENCOUNTER — Other Ambulatory Visit: Payer: Self-pay

## 2015-04-17 ENCOUNTER — Other Ambulatory Visit: Payer: Self-pay

## 2015-05-14 ENCOUNTER — Other Ambulatory Visit: Payer: Self-pay | Admitting: Family Medicine

## 2015-06-30 ENCOUNTER — Emergency Department (HOSPITAL_COMMUNITY): Payer: Self-pay

## 2015-06-30 ENCOUNTER — Encounter (HOSPITAL_COMMUNITY): Payer: Self-pay | Admitting: Emergency Medicine

## 2015-06-30 ENCOUNTER — Emergency Department (HOSPITAL_COMMUNITY)
Admission: EM | Admit: 2015-06-30 | Discharge: 2015-06-30 | Disposition: A | Discharge status: Home / Self Care | Payer: Self-pay | Attending: Emergency Medicine | Admitting: Emergency Medicine

## 2015-06-30 DIAGNOSIS — Z79899 Other long term (current) drug therapy: Secondary | ICD-10-CM | POA: Insufficient documentation

## 2015-06-30 DIAGNOSIS — F419 Anxiety disorder, unspecified: Secondary | ICD-10-CM | POA: Insufficient documentation

## 2015-06-30 DIAGNOSIS — I319 Disease of pericardium, unspecified: Secondary | ICD-10-CM

## 2015-06-30 DIAGNOSIS — I10 Essential (primary) hypertension: Secondary | ICD-10-CM | POA: Insufficient documentation

## 2015-06-30 DIAGNOSIS — Z8619 Personal history of other infectious and parasitic diseases: Secondary | ICD-10-CM | POA: Insufficient documentation

## 2015-06-30 DIAGNOSIS — R079 Chest pain, unspecified: Secondary | ICD-10-CM

## 2015-06-30 DIAGNOSIS — Z7982 Long term (current) use of aspirin: Secondary | ICD-10-CM | POA: Insufficient documentation

## 2015-06-30 DIAGNOSIS — E079 Disorder of thyroid, unspecified: Secondary | ICD-10-CM | POA: Insufficient documentation

## 2015-06-30 LAB — BASIC METABOLIC PANEL
Anion gap: 13 (ref 5–15)
BUN: 7 mg/dL (ref 6–20)
CO2: 21 mmol/L — ABNORMAL LOW (ref 22–32)
CREATININE: 0.6 mg/dL (ref 0.44–1.00)
Calcium: 9.5 mg/dL (ref 8.9–10.3)
Chloride: 104 mmol/L (ref 101–111)
GLUCOSE: 159 mg/dL — AB (ref 65–99)
Potassium: 3.7 mmol/L (ref 3.5–5.1)
Sodium: 138 mmol/L (ref 135–145)

## 2015-06-30 LAB — CBC
HEMATOCRIT: 38.4 % (ref 36.0–46.0)
HEMOGLOBIN: 13.3 g/dL (ref 12.0–15.0)
MCH: 28.2 pg (ref 26.0–34.0)
MCHC: 34.6 g/dL (ref 30.0–36.0)
MCV: 81.5 fL (ref 78.0–100.0)
Platelets: 336 10*3/uL (ref 150–400)
RBC: 4.71 MIL/uL (ref 3.87–5.11)
RDW: 13.7 % (ref 11.5–15.5)
WBC: 12.3 10*3/uL — ABNORMAL HIGH (ref 4.0–10.5)

## 2015-06-30 LAB — CBG MONITORING, ED: Glucose-Capillary: 141 mg/dL — ABNORMAL HIGH (ref 65–99)

## 2015-06-30 LAB — I-STAT BETA HCG BLOOD, ED (MC, WL, AP ONLY): I-stat hCG, quantitative: <5 m[IU]/mL (ref <5)

## 2015-06-30 LAB — TSH: TSH: 0.089 u[IU]/mL — ABNORMAL LOW (ref 0.350–4.500)

## 2015-06-30 LAB — I-STAT TROPONIN, ED
Troponin i, poc: 0 ng/mL (ref 0.00–0.08)
Troponin i, poc: 0 ng/mL (ref 0.00–0.08)

## 2015-06-30 MED ORDER — LORAZEPAM 1 MG PO TABS
1.0000 mg | ORAL_TABLET | Freq: Once | ORAL | Status: AC
  Administered 2015-06-30: 1 mg via ORAL
  Filled 2015-06-30: qty 1

## 2015-06-30 MED ORDER — IBUPROFEN 800 MG PO TABS: 800.0000 mg | ORAL_TABLET | Freq: Three times a day (TID) | ORAL | Status: DC

## 2015-06-30 MED ORDER — KETOROLAC TROMETHAMINE 30 MG/ML IJ SOLN
30.0000 mg | Freq: Once | INTRAMUSCULAR | Status: AC
  Administered 2015-06-30: 30 mg via INTRAVENOUS
  Filled 2015-06-30: qty 1

## 2015-06-30 MED ORDER — IOHEXOL 350 MG/ML SOLN
100.0000 mL | Freq: Once | INTRAVENOUS | Status: AC | PRN
  Administered 2015-06-30: 100 mL via INTRAVENOUS

## 2015-06-30 MED ORDER — HYDROMORPHONE HCL 1 MG/ML IJ SOLN
1.0000 mg | Freq: Once | INTRAMUSCULAR | Status: AC
  Administered 2015-06-30: 1 mg via INTRAVENOUS
  Filled 2015-06-30: qty 1

## 2015-06-30 MED ORDER — IBUPROFEN 800 MG PO TABS
800.0000 mg | ORAL_TABLET | Freq: Once | ORAL | Status: AC
  Administered 2015-06-30: 800 mg via ORAL
  Filled 2015-06-30: qty 1

## 2015-06-30 MED ORDER — GI COCKTAIL ~~LOC~~
30.0000 mL | Freq: Once | ORAL | Status: AC
  Administered 2015-06-30: 30 mL via ORAL
  Filled 2015-06-30: qty 30

## 2015-06-30 MED ORDER — FENTANYL CITRATE (PF) 100 MCG/2ML IJ SOLN
50.0000 ug | Freq: Once | INTRAMUSCULAR | Status: AC
  Administered 2015-06-30: 50 ug via INTRAVENOUS
  Filled 2015-06-30: qty 2

## 2015-06-30 NOTE — ED Notes (Signed)
Cp, onset 0300, central chest radiate both shoulders, nausea, clammy, VSS. Abnormal ECG per EMS.  CBG at triage 141.  No hx of these symptoms, took 6 baby aspirins prior to ems arrival, was given 2 SL NTG with ems, no relief, given 4 zofran by ems.

## 2015-06-30 NOTE — ED Provider Notes (Signed)
CSN: [id number]     Arrival date & time 06/30/15  1131 History   First MD Initiated Contact with Patient 06/30/15 1134     Chief Complaint  Patient presents with  . Chest Pain     (Consider location/radiation/quality/duration/timing/severity/associated sxs/prior Treatment) HPI Comments: 44 year old female here after going to the bathroom and developing chest pain. She woke up with crampy abdominal pain had a runny bowel movement. After that she started having chest pain. Described as tightness, radiating to both shoulders. No alleviating or exacerbating factors. Benadryl given nitroglycerin without relief. She did take aspirin as directed by 911 dispatcher.  Patient is a 44 y.o. female presenting with chest pain. The history is provided by the patient.  Chest Pain Pain location:  Substernal area Pain quality: tightness   Pain radiates to:  L shoulder and R shoulder Pain radiates to the back: no   Pain severity:  Severe Onset quality:  Sudden Timing:  Constant Progression:  Unchanged Chronicity:  New Context comment:  After using the bathrrom Relieved by:  Nothing Worsened by:  Nothing tried Associated symptoms: no cough, no fever and no shortness of breath     Past Medical History  Diagnosis Date  . HSV-2 infection   . Hypertension Dx 2013  . Anxiety Dx 2007  . Thyroid disease Dx 2015   Past Surgical History  Procedure Laterality Date  . Bartholin gland cyst excision  10/05  . Cesarean section  1993, 1996    x2  . Tubal ligation  1996   Family History  Problem Relation Age of Onset  . Hyperlipidemia Mother   . Hypertension Mother   . Diabetes Father   . Heart disease Father   . Hypertension Sister    History  Substance Use Topics  . Smoking status: Never Smoker   . Smokeless tobacco: Never Used  . Alcohol Use: 0.5 oz/week    1 Standard drinks or equivalent per week     Comment: occasionally   OB History    Gravida Para Term Preterm AB TAB SAB Ectopic  Multiple Living   3 2        2      Review of Systems  Constitutional: Negative for fever.  Respiratory: Negative for cough and shortness of breath.   Cardiovascular: Positive for chest pain.  All other systems reviewed and are negative.     Allergies  Review of patient's allergies indicates no known allergies.  Home Medications   Prior to Admission medications   Medication Sig Start Date End Date Taking? Authorizing Provider  ALPRAZolam (XANAX) 0.25 MG tablet Take 1 tablet (0.25 mg total) by mouth at bedtime as needed for sleep. 03/19/15   Dessa PhiJosalyn Funches, MD  aspirin EC 81 MG EC tablet Take 1 tablet (81 mg total) by mouth daily. 01/29/15   Belkys A Regalado, MD  feeding supplement, ENSURE COMPLETE, (ENSURE COMPLETE) LIQD Take 237 mLs by mouth 2 (two) times daily between meals. 01/29/15   Belkys A Regalado, MD  loperamide (IMODIUM) 2 MG capsule Take 1 capsule (2 mg total) by mouth as needed for diarrhea or loose stools. 01/29/15   Belkys A Regalado, MD  methimazole (TAPAZOLE) 10 MG tablet Take 1 tablet (10 mg total) by mouth 3 (three) times daily. 03/27/15   Josalyn Funches, MD  metoprolol tartrate (LOPRESSOR) 25 MG tablet Take 1 tablet (25 mg total) by mouth 2 (two) times daily. 03/05/15   Quentin Angstlugbemiga E Jegede, MD   BP 117/90 mmHg  Pulse  86  Temp(Src) 97.9 F (36.6 C) (Oral)  Resp 17  Ht 5\' 5"  (1.651 m)  Wt 130 lb (58.968 kg)  BMI 21.63 kg/m2  SpO2 99%  LMP 06/18/2015 Physical Exam  Constitutional: She is oriented to person, place, and time. She appears well-developed and well-nourished. No distress.  HENT:  Head: Normocephalic and atraumatic.  Mouth/Throat: Oropharynx is clear and moist.  Eyes: EOM are normal. Pupils are equal, round, and reactive to light.  Neck: Normal range of motion. Neck supple.  Cardiovascular: Normal rate and regular rhythm.  Exam reveals no friction rub.   No murmur heard. Pulmonary/Chest: Effort normal and breath sounds normal. No respiratory distress.  She has no wheezes. She has no rales.  Abdominal: Soft. She exhibits no distension. There is no tenderness. There is no rebound.  Musculoskeletal: Normal range of motion. She exhibits no edema.  Neurological: She is alert and oriented to person, place, and time.  Skin: No rash noted. She is not diaphoretic.  Nursing note and vitals reviewed.   ED Course  Procedures (including critical care time) Labs Review Labs Reviewed  CBG MONITORING, ED - Abnormal; Notable for the following:    Glucose-Capillary 141 (*)    All other components within normal limits  CBC  BASIC METABOLIC PANEL  I-STAT TROPOININ, ED    Imaging Review Dg Chest 2 View  06/30/2015   CLINICAL DATA:  Chest tightness, pressure beginning at 3 a.m. this morning. Shortness of breath.  EXAM: CHEST  2 VIEW  COMPARISON:  None.  FINDINGS: Right lung is clear. Heart is borderline in size. Left basilar airspace opacity could reflect atelectasis or infiltrate/pneumonia. No effusions. No acute bony abnormality.  IMPRESSION: Borderline cardiomegaly.  Left lower lobe atelectasis or infiltrate. Cannot exclude pneumonia.   Electronically Signed   By: Charlett Nose M.D.   On: 06/30/2015 13:02   Ct Angio Chest Aorta W/cm &/or Wo/cm  06/30/2015   CLINICAL DATA:  Severe chest pain radiating to both shoulders. Shortness of breath. Diaphoresis.  EXAM: CT ANGIOGRAPHY CHEST AND ABDOMEN  TECHNIQUE: Multidetector CT imaging of the chest and abdomen was performed using the standard protocol during bolus administration of intravenous contrast. Multiplanar CT image reconstructions and MIPs were obtained to evaluate the vascular anatomy.  CONTRAST:  OMNIPAQUE IOHEXOL 350 MG/ML SOLN  COMPARISON:  Chest x-ray dated 06/30/2015  FINDINGS: CTA CHEST FINDINGS  There is no aortic dissection or evidence of pulmonary emboli. Lungs are clear. There is slight cardiomegaly. No Coronary artery or early calcifications. Slight residual thymus tissue.  The patient has  marked enlargement of the thyroid gland with uniform enhancement after contrast administration. The possibility of Graves disease should be considered.  Osseous structures are normal.  Review of the MIP images confirms the above findings.  CTA ABDOMEN FINDINGS  The abdominal aorta and a visceral muscles are normal.  Liver, biliary tree, spleen, pancreas, adrenal glands, and kidneys are normal. The bowel is normal except for a few diverticula in the sigmoid portion of the colon.  2 cm cyst or prominent follicle on the right ovary. Possible small right hydrosalpinx. Left ovary is normal. Bladder is normal.  No osseous abnormality.  Review of the MIP images confirms the above findings.  IMPRESSION: 1. Diffusely enlarged uniform thyroid gland. The possibility of Graves disease should be considered. 2. Otherwise normal CTA of the chest. 3. No acute abnormality of the abdomen or pelvis. Small cyst or dominant follicle on the right ovary. Possible small right hydrosalpinx.  Electronically Signed   By: Francene Boyers M.D.   On: 06/30/2015 15:53   Ct Angio Abd/pel W/ And/or W/o  06/30/2015   CLINICAL DATA:  Severe chest pain radiating to both shoulders. Shortness of breath. Diaphoresis.  EXAM: CT ANGIOGRAPHY CHEST AND ABDOMEN  TECHNIQUE: Multidetector CT imaging of the chest and abdomen was performed using the standard protocol during bolus administration of intravenous contrast. Multiplanar CT image reconstructions and MIPs were obtained to evaluate the vascular anatomy.  CONTRAST:  OMNIPAQUE IOHEXOL 350 MG/ML SOLN  COMPARISON:  Chest x-ray dated 06/30/2015  FINDINGS: CTA CHEST FINDINGS  There is no aortic dissection or evidence of pulmonary emboli. Lungs are clear. There is slight cardiomegaly. No Coronary artery or early calcifications. Slight residual thymus tissue.  The patient has marked enlargement of the thyroid gland with uniform enhancement after contrast administration. The possibility of Graves disease  should be considered.  Osseous structures are normal.  Review of the MIP images confirms the above findings.  CTA ABDOMEN FINDINGS  The abdominal aorta and a visceral muscles are normal.  Liver, biliary tree, spleen, pancreas, adrenal glands, and kidneys are normal. The bowel is normal except for a few diverticula in the sigmoid portion of the colon.  2 cm cyst or prominent follicle on the right ovary. Possible small right hydrosalpinx. Left ovary is normal. Bladder is normal.  No osseous abnormality.  Review of the MIP images confirms the above findings.  IMPRESSION: 1. Diffusely enlarged uniform thyroid gland. The possibility of Graves disease should be considered. 2. Otherwise normal CTA of the chest. 3. No acute abnormality of the abdomen or pelvis. Small cyst or dominant follicle on the right ovary. Possible small right hydrosalpinx.   Electronically Signed   By: Francene Boyers M.D.   On: 06/30/2015 15:53     EKG Interpretation   Date/Time:  Saturday June 30 2015 11:35:33 EDT Ventricular Rate:  86 PR Interval:  137 QRS Duration: 83 QT Interval:  454 QTC Calculation: 543 R Axis:   66 Text Interpretation:  Sinus rhythm Probable left atrial enlargement  Probable left ventricular hypertrophy ST elev, probable normal early repol  pattern Prolonged QT interval No significant change since last tracing  Confirmed by Gwendolyn Grant  MD, Bohdi Leeds ([id number]) on 06/30/2015 11:43:08 AM      MDM   Final diagnoses:  Chest pain  Pericarditis    44 year old Inetta Fermo here with chest tightness. EKG sent in by EMS was concerning for possible inferior STEMI. EKG here shows some mild diffuse ST elevation, it is similar to prior. There is no definitive criteria for STEMI. I reviewed this with Dr. Diona Browner at 11:45 AM and he confirmed this also. A she is uncomfortable. Will give GI cocktail, pain medicine. Unclear if this GI or cardiac related. She has history of hypertension and hyperthyroidism. Possible pericarditis with some  EKG elevation.  Persistent pain despite pain meds, CT done to look for dissection, PE, PNA. CT negative.  Feeling much better after toradol. No effusion on CT. Could be pericarditis. Will give motrin for pain, Cards f/u.  Elwin Mocha, MD 06/30/15 [phone]

## 2015-06-30 NOTE — ED Notes (Signed)
CBG - 141 ° °

## 2015-06-30 NOTE — Discharge Instructions (Signed)
Pericarditis °Pericarditis is swelling (inflammation) of the pericardium. The pericardium is a thin, double-layered, fluid-filled tissue sac that surrounds the heart. The purpose of the pericardium is to contain the heart in the chest cavity and keep the heart from overexpanding. Different types of pericarditis can occur, such as: °· Acute pericarditis. Inflammation can develop suddenly in acute pericarditis. °· Chronic pericarditis. Inflammation develops gradually and is long-lasting in chronic pericarditis. °· Constrictive pericarditis. In this type of pericarditis, the layers of the pericardium stiffen and develop scar tissue. The scar tissue thickens and sticks together. This makes it difficult for the heart to pump and work as it normally does. °CAUSES  °Pericarditis can be caused from different conditions, such as: °· A bacterial, fungal or viral infection. °· After a heart attack (myocardial infarction). °· After open-heart surgery (coronary bypass graft surgery). °· Auto-immune conditions such as lupus, rheumatoid arthritis or scleroderma. °· Kidney failure. °· Low thyroid condition (hypothyroidism). °· Cancer from another part of the body that has spread (metastasized) to the pericardium. °· Chest injury or trauma. °· After radiation treatment. °· Certain medicines. °SYMPTOMS  °Symptoms of pericarditis can include: °· Chest pain. Chest pain symptoms may increase when laying down and may be relieved when sitting up and leaning forward. °· A chronic, dry cough. °· Heart palpitations. These may feel like rapid, fluttering or pounding heart beats. °· Chest pain may be worse when swallowing. °· Dizziness or fainting. °· Tiredness, fatigue or lethargy. °· Fever. °DIAGNOSIS  °Pericarditis is diagnosed by the following: °· A physical exam. A heart sound called a pericardial friction rub may be heard when your caregiver listens to your heart. °· Blood work. Blood may be drawn to check for an infection and to look at  your blood chemistry. °· Electrocardiography. During electrocardiography your heart's electrical activity is monitored and recorded with a tracing on paper (electrocardiogram [ECG]). °· Echocardiography. °· Computed tomography (CT). °· Magnetic resonance image (MRI). °TREATMENT  °To treat pericarditis, it is important to know the cause of it. The cause of pericarditis determines the treatment.  °· If the cause of pericarditis is due to an infection, treatment is based on the type of infection. If an infection is suspected in the pericardial fluid, a procedure called a pericardial fluid culture and biopsy may be done. This takes a sample of the pericardial fluid. The sample is sent to a lab which runs tests on the pericardial fluid to check for an infection. °· If the autoimmune disease is the cause, treatment of the autoimmune condition will help improve the pericarditis. °· If the cause of pericarditis is not known, anti-inflammatory medicines may be used to help decrease the inflammation. °· Surgery may be needed. The following are types of surgeries or procedures that may be done to treat pericarditis: °¨ Pericardial window. A pericardial window makes a cut (incision) into the pericardial sac. This allows excess fluid in the pericardium to drain. °¨ Pericardiocentesis. A pericardiocentesis is also known as a pericardial tap. This procedure uses a needle that is guided by X-ray to drain (aspirate) excess fluid from the pericardium. °¨ Pericardiectomy. A pericardiectomy removes part or all of the pericardium. °HOME CARE INSTRUCTIONS  °· Do not smoke. If you smoke, quit. Your caregiver can help you quit smoking. °· Maintain a healthy weight. °· Follow an exercise program as told by your caregiver. °· If you drink alcohol, do so in moderation. °· Eat a heart healthy diet. A registered dietician can help you learn about   healthy food choices. °· Keep a list of all your medicines with you at all times. Include the name,  dose, how often it is taken and how it is taken. °SEEK IMMEDIATE MEDICAL CARE IF:  °· You have chest pain or feelings of chest pressure. °· You have sweating (diaphoresis) when at rest. °· You have irregular heartbeats (palpitations). °· You have rapid, racing heart beats. °· You have unexplained fainting episodes. °· You feel sick to your stomach (nausea) or vomiting without cause. °· You have unexplained weakness. °If you develop any of the symptoms which originally made you seek care, call for local emergency medical help. Do not drive yourself to the hospital. °Document Released: 06/10/2001 Document Revised: 03/08/2012 Document Reviewed: 12/17/2011 °Exit[employer]® Patient Information ©2015 [employer]. This information is not intended to replace advice given to you by your health care provider. Make sure you discuss any questions you have with your health care provider. ° °

## 2015-06-30 NOTE — ED Notes (Signed)
Dr. Wright at bedside.

## 2015-06-30 NOTE — ED Notes (Signed)
Pt. Is going to x-ray. 

## 2015-06-30 NOTE — ED Notes (Signed)
Pt HR noted to be 121 during DC. Informed Dr. Gwendolyn GrantWalden, new meds ordered.  To watch pt for 30 minutes.

## 2015-09-27 ENCOUNTER — Other Ambulatory Visit: Payer: Self-pay | Admitting: Internal Medicine

## 2015-11-21 ENCOUNTER — Telehealth: Payer: Self-pay

## 2015-11-21 NOTE — Telephone Encounter (Signed)
Nurse called patient, reached voicemail. Left message for patient to [facility] at [phone]. Refill requested for metoprolol on 09/27/15. Patient last seen 03/19/15. Patient needs appointment.

## 2015-12-05 ENCOUNTER — Other Ambulatory Visit: Payer: Self-pay | Admitting: Family Medicine

## 2015-12-05 ENCOUNTER — Other Ambulatory Visit: Payer: Self-pay | Admitting: Internal Medicine

## 2015-12-18 ENCOUNTER — Other Ambulatory Visit: Payer: Self-pay | Admitting: Internal Medicine

## 2015-12-18 ENCOUNTER — Other Ambulatory Visit: Payer: Self-pay | Admitting: Family Medicine

## 2015-12-25 NOTE — Telephone Encounter (Signed)
Called patient as patient needs appointment with provider for refills. Unable to reach patient as first number was the wrong number and the next number was the wrong number as well.  Refilled the methimazole as patient has had TSH in last year. [facility] has been trying to contact patient for metoprolol refills and have been unsucessful. Will not refill it at this time.

## 2015-12-27 ENCOUNTER — Encounter: Payer: Self-pay | Admitting: Family Medicine

## 2015-12-27 ENCOUNTER — Ambulatory Visit: Payer: Self-pay | Attending: Family Medicine | Admitting: Family Medicine

## 2015-12-27 VITALS — BP 158/90 | HR 76 | Temp 98.3°F | Resp 16 | Ht 66.0 in | Wt 149.0 lb

## 2015-12-27 DIAGNOSIS — I10 Essential (primary) hypertension: Secondary | ICD-10-CM

## 2015-12-27 DIAGNOSIS — E0590 Thyrotoxicosis, unspecified without thyrotoxic crisis or storm: Secondary | ICD-10-CM

## 2015-12-27 DIAGNOSIS — Z0000 Encounter for general adult medical examination without abnormal findings: Secondary | ICD-10-CM

## 2015-12-27 LAB — TSH

## 2015-12-27 LAB — T4, FREE: Free T4: 8.76 ng/dL — ABNORMAL HIGH (ref 0.80–1.80)

## 2015-12-27 LAB — T3, FREE

## 2015-12-27 MED ORDER — METOPROLOL TARTRATE 25 MG PO TABS: 25.0000 mg | ORAL_TABLET | Freq: Two times a day (BID) | ORAL | Status: DC

## 2015-12-27 MED ORDER — ALPRAZOLAM 0.25 MG PO TABS: 0.2500 mg | ORAL_TABLET | Freq: Every evening | ORAL | Status: DC | PRN

## 2015-12-27 MED ORDER — METHIMAZOLE 10 MG PO TABS: 10.0000 mg | ORAL_TABLET | Freq: Three times a day (TID) | ORAL | Status: DC

## 2015-12-27 NOTE — Progress Notes (Signed)
Subjective:  Patient ID: Donna Ray, female    DOB: 06/22/1971  Age: 44 y.o. MRN: [id number]  CC: Follow-up and medication refills   HPI Donna MannersMarlo Paxson presents for   1. Hyperthyroidism: having tremor. No meds x 20 days. Feeling anxious. No CP or SOB. Not sleeping soundly  2. HTN: no toprol. No HA, CP, SOB or swelling.  Social History  Substance Use Topics  . Smoking status: Never Smoker   . Smokeless tobacco: Never Used  . Alcohol Use: 0.5 oz/week    1 Standard drinks or equivalent per week     Comment: occasionally    Outpatient Prescriptions Prior to Visit  Medication Sig Dispense Refill  . aspirin EC 81 MG EC tablet Take 1 tablet (81 mg total) by mouth daily. 30 tablet 0  . ibuprofen (ADVIL,MOTRIN) 800 MG tablet Take 1 tablet (800 mg total) by mouth 3 (three) times daily. 21 tablet 0  . methimazole (TAPAZOLE) 10 MG tablet Take 1 tablet (10 mg total) by mouth 3 (three) times daily. Patient needs office visit for refills 90 tablet 0  . metoprolol tartrate (LOPRESSOR) 25 MG tablet Take 1 tablet (25 mg total) by mouth 2 (two) times daily. 60 tablet 2  . ALPRAZolam (XANAX) 0.25 MG tablet Take 1 tablet (0.25 mg total) by mouth at bedtime as needed for sleep. (Patient not taking: Reported on 06/30/2015) 30 tablet 0  . feeding supplement, ENSURE COMPLETE, (ENSURE COMPLETE) LIQD Take 237 mLs by mouth 2 (two) times daily between meals. (Patient taking differently: Take 237 mLs by mouth daily. ) 30 Bottle 0  . loperamide (IMODIUM) 2 MG capsule Take 1 capsule (2 mg total) by mouth as needed for diarrhea or loose stools. (Patient not taking: Reported on 12/27/2015) 30 capsule 0   No facility-administered medications prior to visit.    ROS Review of Systems  Constitutional: Negative for fever and chills.  Eyes: Negative for visual disturbance.  Respiratory: Negative for shortness of breath.   Cardiovascular: Negative for chest pain.  Gastrointestinal: Negative for abdominal pain  and blood in stool.  Musculoskeletal: Negative for back pain and arthralgias.  Skin: Negative for rash.  Allergic/Immunologic: Negative for immunocompromised state.  Neurological: Positive for tremors.  Hematological: Negative for adenopathy. Does not bruise/bleed easily.  Psychiatric/Behavioral: Negative for suicidal ideas and dysphoric mood.    Objective:  BP 158/90 mmHg  Pulse 76  Temp(Src) 98.3 F (36.8 C)  Resp 16  Ht 5\' 6"  (1.676 m)  Wt 149 lb (67.586 kg)  BMI 24.06 kg/m2  SpO2 100%  BP/Weight 12/27/2015 06/30/2015 03/19/2015  Systolic BP 158 133 120  Diastolic BP 90 78 68  Wt. (Lbs) 149 130 135  BMI 24.06 21.63 22.12     Physical Exam  Constitutional: She is oriented to person, place, and time. She appears well-developed and well-nourished. No distress.  HENT:  Head: Normocephalic and atraumatic.  Cardiovascular: Normal rate, regular rhythm, normal heart sounds and intact distal pulses.   Pulmonary/Chest: Effort normal and breath sounds normal.  Musculoskeletal: She exhibits no edema.  Neurological: She is alert and oriented to person, place, and time. She displays tremor.  Skin: Skin is warm and dry. No rash noted.  Psychiatric: She has a normal mood and affect.    Assessment & Plan:   Problem List Items Addressed This Visit    HTN (hypertension) (Chronic)    A; HTN Med: non compliant P: Refilled metoprolol       Relevant Medications  metoprolol tartrate (LOPRESSOR) 25 MG tablet   Hyperthyroidism - Primary (Chronic)    A: hyperthyroidism P: Reordered methimazole TFTs Endocrine referral       Relevant Medications   methimazole (TAPAZOLE) 10 MG tablet   metoprolol tartrate (LOPRESSOR) 25 MG tablet   ALPRAZolam (XANAX) 0.25 MG tablet   Other Relevant Orders   TSH   T3, Free   T4, free   Ambulatory referral to Endocrinology    Other Visit Diagnoses    Healthcare maintenance        Relevant Orders    Flu Vaccine QUAD 36+ mos IM (Completed)        No orders of the defined types were placed in this encounter.    Follow-up: No Follow-up on file.   Dessa Phi MD

## 2015-12-27 NOTE — Assessment & Plan Note (Signed)
A: hyperthyroidism P: Reordered methimazole TFTs Endocrine referral

## 2015-12-27 NOTE — Patient Instructions (Addendum)
Donna Ray was seen today for follow-up and medication refills.  Diagnoses and all orders for this visit:  Hyperthyroidism -     methimazole (TAPAZOLE) 10 MG tablet; Take 1 tablet (10 mg total) by mouth 3 (three) times daily. -     TSH -     T3, Free -     T4, free -     Ambulatory referral to Endocrinology -     ALPRAZolam (XANAX) 0.25 MG tablet; Take 1 tablet (0.25 mg total) by mouth at bedtime as needed for sleep.  Essential hypertension -     metoprolol tartrate (LOPRESSOR) 25 MG tablet; Take 1 tablet (25 mg total) by mouth 2 (two) times daily.   You will be called with lab results   F/u in 4-6 weeks or pap smear  Dr. Armen PickupFunches

## 2015-12-27 NOTE — Progress Notes (Signed)
Patient here for follow up on her tyroid disease and for medication refills Patient presents with elevated blood pressure but has been out of her medications

## 2015-12-27 NOTE — Assessment & Plan Note (Signed)
A; HTN Med: non compliant P: Refilled metoprolol

## 2016-01-01 ENCOUNTER — Telehealth: Payer: Self-pay

## 2016-01-01 NOTE — Telephone Encounter (Signed)
-----   Message from Dessa PhiJosalyn Funches, MD sent at 12/28/2015  8:47 AM EST ----- Labs consistent with hyperthyroidism Restarted methimazole F/u and repeat labs in 8 weeks

## 2016-01-01 NOTE — Telephone Encounter (Signed)
Spoke with patient this am and she is aware of her lab results Patient already picked up her prescription at the pharmacy

## 2016-01-02 ENCOUNTER — Ambulatory Visit: Payer: Self-pay | Attending: Family Medicine

## 2016-03-21 MED FILL — methIMAzole 10 MG TABS: 10 | 30 days supply | Qty: 90 | Fill #0

## 2016-03-21 MED FILL — ?METOPROLOL 25 MG TABLET: 25 | 30 days supply | Qty: 60 | Fill #1

## 2016-06-02 ENCOUNTER — Encounter (HOSPITAL_COMMUNITY): Payer: Self-pay | Admitting: Emergency Medicine

## 2016-06-02 ENCOUNTER — Emergency Department (HOSPITAL_COMMUNITY)
Admission: EM | Admit: 2016-06-02 | Discharge: 2016-06-02 | Disposition: A | Discharge status: Home / Self Care | Payer: Self-pay | Attending: Emergency Medicine | Admitting: Emergency Medicine

## 2016-06-02 ENCOUNTER — Telehealth: Payer: Self-pay | Admitting: Family Medicine

## 2016-06-02 ENCOUNTER — Emergency Department (HOSPITAL_COMMUNITY): Payer: Self-pay

## 2016-06-02 DIAGNOSIS — Z79899 Other long term (current) drug therapy: Secondary | ICD-10-CM | POA: Insufficient documentation

## 2016-06-02 DIAGNOSIS — Z7982 Long term (current) use of aspirin: Secondary | ICD-10-CM | POA: Insufficient documentation

## 2016-06-02 DIAGNOSIS — Y999 Unspecified external cause status: Secondary | ICD-10-CM | POA: Insufficient documentation

## 2016-06-02 DIAGNOSIS — M79641 Pain in right hand: Secondary | ICD-10-CM | POA: Insufficient documentation

## 2016-06-02 DIAGNOSIS — M79674 Pain in right toe(s): Secondary | ICD-10-CM

## 2016-06-02 DIAGNOSIS — I10 Essential (primary) hypertension: Secondary | ICD-10-CM | POA: Insufficient documentation

## 2016-06-02 DIAGNOSIS — Y939 Activity, unspecified: Secondary | ICD-10-CM | POA: Insufficient documentation

## 2016-06-02 DIAGNOSIS — S90121A Contusion of right lesser toe(s) without damage to nail, initial encounter: Secondary | ICD-10-CM | POA: Insufficient documentation

## 2016-06-02 DIAGNOSIS — W0110XA Fall on same level from slipping, tripping and stumbling with subsequent striking against unspecified object, initial encounter: Secondary | ICD-10-CM | POA: Insufficient documentation

## 2016-06-02 DIAGNOSIS — Y929 Unspecified place or not applicable: Secondary | ICD-10-CM | POA: Insufficient documentation

## 2016-06-02 NOTE — Telephone Encounter (Signed)
Pt. Called stating that she went to the ED for pain on her toe. Pt. Heart rate was 122  And the ED did not want to release her. She has an appointment with her PCP on 06/13/16 and would like to know if it is ok to wait. Please f/u

## 2016-06-02 NOTE — ED Provider Notes (Signed)
CSN: [id number]     Arrival date & time 06/02/16  1219 History  By signing my name below, I, Soijett Blue, attest that this documentation has been prepared under the direction and in the presence of Burna FortsJeff Raheel Kunkle, PA-C Electronically Signed: Soijett Blue, ED Scribe. 06/02/2016. 1:31 PM.   Chief Complaint  Patient presents with  . Toe Pain  . Hand Pain      The history is provided by the patient. No language interpreter was used.    Donna Ray is a 45 y.o. female with a medical hx of HTN, hyperthyroidism who presents to the Emergency Department complaining of right toe pain onset last night. Pt reports that she had a mechanical fall forward last night and braced herself with her right hand striking her right 2nd/3rd toes on hardwood floor. Pt states that she has broken her right 2nd toe in the past and wanted to be evaluated. Pt is having associated symptoms of right hand pain. She notes that she has tried 800 mg ibuprofen for the relief of her symptoms. She denies color change, wound, rash, right calf tenderness, and any other symptoms. Pt takes tapazole for her hyperthyroidism. Pt reports that she has been without her Rx medications for 2 days and she has a refill that she is picking up today.   Past Medical History  Diagnosis Date  . HSV-2 infection   . Hypertension Dx 2013  . Anxiety Dx 2007  . Thyroid disease Dx 2015   Past Surgical History  Procedure Laterality Date  . Bartholin gland cyst excision  10/05  . Cesarean section  1993, 1996    x2  . Tubal ligation  1996   Family History  Problem Relation Age of Onset  . Hyperlipidemia Mother   . Hypertension Mother   . Diabetes Father   . Heart disease Father   . Hypertension Sister    Social History  Substance Use Topics  . Smoking status: Never Smoker   . Smokeless tobacco: Never Used  . Alcohol Use: 0.5 oz/week    1 Standard drinks or equivalent per week     Comment: occasionally   OB History    Gravida Para Term  Preterm AB TAB SAB Ectopic Multiple Living   3 2        2      Review of Systems  Musculoskeletal: Positive for arthralgias. Negative for joint swelling.  Skin: Negative for color change, rash and wound.    Allergies  Review of patient's allergies indicates no known allergies.  Home Medications   Prior to Admission medications   Medication Sig Start Date End Date Taking? Authorizing Provider  ALPRAZolam (XANAX) 0.25 MG tablet Take 1 tablet (0.25 mg total) by mouth at bedtime as needed for sleep. 12/27/15   Dessa PhiJosalyn Funches, MD  aspirin EC 81 MG EC tablet Take 1 tablet (81 mg total) by mouth daily. 01/29/15   Belkys A Regalado, MD  ibuprofen (ADVIL,MOTRIN) 800 MG tablet Take 1 tablet (800 mg total) by mouth 3 (three) times daily. 06/30/15   Elwin MochaBlair Walden, MD  methimazole (TAPAZOLE) 10 MG tablet Take 1 tablet (10 mg total) by mouth 3 (three) times daily. 12/27/15   Dessa PhiJosalyn Funches, MD  metoprolol tartrate (LOPRESSOR) 25 MG tablet Take 1 tablet (25 mg total) by mouth 2 (two) times daily. 12/27/15   Josalyn Funches, MD   BP 149/87 mmHg  Pulse 127  Temp(Src) 98 F (36.7 C) (Oral)  Resp 16  SpO2 100%  LMP  05/08/2016   Physical Exam  Constitutional: She is oriented to person, place, and time. She appears well-developed and well-nourished. No distress.  HENT:  Head: Normocephalic and atraumatic.  Eyes: EOM are normal.  Neck: Neck supple.  Cardiovascular: Normal rate.   Pulmonary/Chest: Effort normal. No respiratory distress.  Abdominal: She exhibits no distension.  Musculoskeletal: Normal range of motion.  Bruising over the right third distal toe with TTP. Non-tender to the remaining toes, foot, or ankle. TTP of right thenar eminence. Full active ROM of the wrist and hand. No signs of trauma.   Neurological: She is alert and oriented to person, place, and time.  Skin: Skin is warm and dry.  Psychiatric: She has a normal mood and affect. Her behavior is normal.  Nursing note and vitals  reviewed.   ED Course  Procedures (including critical care time) DIAGNOSTIC STUDIES: Oxygen Saturation is 100% on RA, nl by my interpretation.    COORDINATION OF CARE: 1:26 PM Discussed treatment plan with pt at bedside which includes right hand xray, right 3rd toe xray, and pt agreed to plan.    Labs Review Labs Reviewed - No data to display  Imaging Review Dg Hand Complete Right  06/02/2016  CLINICAL DATA:  Status post fall drain outstretched right hand 1 day ago. Pain about the right thumb. Initial encounter. EXAM: RIGHT HAND - COMPLETE 3+ VIEW COMPARISON:  None. FINDINGS: There is no evidence of fracture or dislocation. There is no evidence of arthropathy or other focal bone abnormality. Soft tissues are unremarkable. IMPRESSION: Negative exam. Electronically Signed   By: Drusilla Kanner M.D.   On: 06/02/2016 14:12   Dg Toe 3rd Right  06/02/2016  CLINICAL DATA:  Stubbed right third toe on table one day ago. EXAM: RIGHT THIRD TOE COMPARISON:  None. FINDINGS: No acute bony abnormality. Specifically, no fracture, subluxation, or dislocation. Soft tissues are intact. IMPRESSION: No acute bony abnormality. Electronically Signed   By: Charlett Nose M.D.   On: 06/02/2016 14:11   I have personally reviewed and evaluated these images as part of my medical decision-making.   EKG Interpretation None      MDM   Final diagnoses:  Pain of toe of right foot  Pain of right hand   Labs:   Imaging: right hand: IMPRESSION: Negative exam. Right 3rd toe: IMPRESSION: No acute bony abnormality.  Consults:   Therapeutics:   Discharge Meds:   Assessment/Plan: Patient X-Ray negative for obvious fracture or dislocation. Conservative therapy recommended and discussed. Patient will be discharged home & is agreeable with above plan. Returns precautions discussed. Pt appears safe for discharge.  Pt states that she is aware of her tachycardia and that occurs when she doesn't take her thyroid  medications. Pt has been out of her medications x 2 days and she has a refill that she is picking up later today. Pt denies any complaints and denies any further work up at this time.   I personally performed the services described in this documentation, which was scribed in my presence. The recorded information has been reviewed and is accurate.   Eyvonne Mechanic, PA-C 06/02/16 1728  Arby Barrette, MD 06/15/16 [phone]

## 2016-06-02 NOTE — ED Notes (Signed)
Pt states that she tripped last night and hit her rt hand and rt 2nd and 3rd toes.  Pt also states that she has hyperthyroidism and she has tachycardia.

## 2016-06-02 NOTE — Discharge Instructions (Signed)
Cryotherapy °Cryotherapy means treatment with cold. Ice or gel packs can be used to reduce both pain and swelling. Ice is the most helpful within the first 24 to 48 hours after an injury or flare-up from overusing a muscle or joint. Sprains, strains, spasms, burning pain, shooting pain, and aches can all be eased with ice. Ice can also be used when recovering from surgery. Ice is effective, has very few side effects, and is safe for most people to use. °PRECAUTIONS  °Ice is not a safe treatment option for people with: °· Raynaud phenomenon. This is a condition affecting small blood vessels in the extremities. Exposure to cold may cause your problems to return. °· Cold hypersensitivity. There are many forms of cold hypersensitivity, including: °¨ Cold urticaria. Red, itchy hives appear on the skin when the tissues begin to warm after being iced. °¨ Cold erythema. This is a red, itchy rash caused by exposure to cold. °¨ Cold hemoglobinuria. Red blood cells break down when the tissues begin to warm after being iced. The hemoglobin that carry oxygen are passed into the urine because they cannot combine with blood proteins fast enough. °· Numbness or altered sensitivity in the area being iced. °If you have any of the following conditions, do not use ice until you have discussed cryotherapy with your caregiver: °· Heart conditions, such as arrhythmia, angina, or chronic heart disease. °· High blood pressure. °· Healing wounds or open skin in the area being iced. °· Current infections. °· Rheumatoid arthritis. °· Poor circulation. °· Diabetes. °Ice slows the blood flow in the region it is applied. This is beneficial when trying to stop inflamed tissues from spreading irritating chemicals to surrounding tissues. However, if you expose your skin to cold temperatures for too long or without the proper protection, you can damage your skin or nerves. Watch for signs of skin damage due to cold. °HOME CARE INSTRUCTIONS °Follow  these tips to use ice and cold packs safely. °· Place a dry or damp towel between the ice and skin. A damp towel will cool the skin more quickly, so you may need to shorten the time that the ice is used. °· For a more rapid response, add gentle compression to the ice. °· Ice for no more than 10 to 20 minutes at a time. The bonier the area you are icing, the less time it will take to get the benefits of ice. °· Check your skin after 5 minutes to make sure there are no signs of a poor response to cold or skin damage. °· Rest 20 minutes or more between uses. °· Once your skin is numb, you can end your treatment. You can test numbness by very lightly touching your skin. The touch should be so light that you do not see the skin dimple from the pressure of your fingertip. When using ice, most people will feel these normal sensations in this order: cold, burning, aching, and numbness. °· Do not use ice on someone who cannot communicate their responses to pain, such as small children or people with dementia. °HOW TO MAKE AN ICE PACK °Ice packs are the most common way to use ice therapy. Other methods include ice massage, ice baths, and cryosprays. Muscle creams that cause a cold, tingly feeling do not offer the same benefits that ice offers and should not be used as a substitute unless recommended by your caregiver. °To make an ice pack, do one of the following: °· Place crushed ice or a   bag of frozen vegetables in a sealable plastic bag. Squeeze out the excess air. Place this bag inside another plastic bag. Slide the bag into a pillowcase or place a damp towel between your skin and the bag. °· Mix 3 parts water with 1 part rubbing alcohol. Freeze the mixture in a sealable plastic bag. When you remove the mixture from the freezer, it will be slushy. Squeeze out the excess air. Place this bag inside another plastic bag. Slide the bag into a pillowcase or place a damp towel between your skin and the bag. °SEEK MEDICAL CARE  IF: °· You develop white spots on your skin. This may give the skin a blotchy (mottled) appearance. °· Your skin turns blue or pale. °· Your skin becomes waxy or hard. °· Your swelling gets worse. °MAKE SURE YOU:  °· Understand these instructions. °· Will watch your condition. °· Will get help right away if you are not doing well or get worse. °  °This information is not intended to replace advice given to you by your health care provider. Make sure you discuss any questions you have with your health care provider. °  °Document Released: 08/11/2011 Document Revised: 01/05/2015 Document Reviewed: 08/11/2011 °Elsevier Interactive Patient Education ©2016 Elsevier Inc[employer] ° °Musculoskeletal Pain °Musculoskeletal pain is muscle and boney aches and pains. These pains can occur in any part of the body. Your caregiver may treat you without knowing the cause of the pain. They may treat you if blood or urine tests, X-rays, and other tests were normal.  °CAUSES °There is often not a definite cause or reason for these pains. These pains may be caused by a type of germ (virus). The discomfort may also come from overuse. Overuse includes working out too hard when your body is not fit. Boney aches also come from weather changes. Bone is sensitive to atmospheric pressure changes. °HOME CARE INSTRUCTIONS  °· Ask when your test results will be ready. Make sure you get your test results. °· Only take over-the-counter or prescription medicines for pain, discomfort, or fever as directed by your caregiver. If you were given medications for your condition, do not drive, operate machinery or power tools, or sign legal documents for 24 hours. Do not drink alcohol. Do not take sleeping pills or other medications that may interfere with treatment. °· Continue all activities unless the activities cause more pain. When the pain lessens, slowly resume normal activities. Gradually increase the intensity and duration of the activities or  exercise. °· During periods of severe pain, bed rest may be helpful. Lay or sit in any position that is comfortable. °· Putting ice on the injured area. °¨ Put ice in a bag. °¨ Place a towel between your skin and the bag. °¨ Leave the ice on for 15 to 20 minutes, 3 to 4 times a day. °· Follow up with your caregiver for continued problems and no reason can be found for the pain. If the pain becomes worse or does not go away, it may be necessary to repeat tests or do additional testing. Your caregiver may need to look further for a possible cause. °SEEK IMMEDIATE MEDICAL CARE IF: °· You have pain that is getting worse and is not relieved by medications. °· You develop chest pain that is associated with shortness or breath, sweating, feeling sick to your stomach (nauseous), or throw up (vomit). °· Your pain becomes localized to the abdomen. °· You develop any new symptoms that seem different or that concern   you. °MAKE SURE YOU:  °· Understand these instructions. °· Will watch your condition. °· Will get help right away if you are not doing well or get worse. °  °This information is not intended to replace advice given to you by your health care provider. Make sure you discuss any questions you have with your health care provider. °  °Document Released: 12/15/2005 Document Revised: 03/08/2012 Document Reviewed: 08/19/2013 °Elsevier Interactive Patient Education ©2016 Elsevier Inc. ° °

## 2016-06-04 ENCOUNTER — Encounter (HOSPITAL_COMMUNITY): Payer: Self-pay

## 2016-06-04 ENCOUNTER — Emergency Department (HOSPITAL_COMMUNITY)
Admission: EM | Admit: 2016-06-04 | Discharge: 2016-06-04 | Disposition: A | Discharge status: Home / Self Care | Payer: Self-pay | Attending: Emergency Medicine | Admitting: Emergency Medicine

## 2016-06-04 DIAGNOSIS — E0590 Thyrotoxicosis, unspecified without thyrotoxic crisis or storm: Secondary | ICD-10-CM | POA: Insufficient documentation

## 2016-06-04 DIAGNOSIS — Z7982 Long term (current) use of aspirin: Secondary | ICD-10-CM | POA: Insufficient documentation

## 2016-06-04 DIAGNOSIS — I10 Essential (primary) hypertension: Secondary | ICD-10-CM | POA: Insufficient documentation

## 2016-06-04 DIAGNOSIS — Z79899 Other long term (current) drug therapy: Secondary | ICD-10-CM | POA: Insufficient documentation

## 2016-06-04 DIAGNOSIS — R000 Tachycardia, unspecified: Secondary | ICD-10-CM | POA: Insufficient documentation

## 2016-06-04 LAB — I-STAT CHEM 8, ED
BUN: 8 mg/dL (ref 6–20)
Calcium, Ion: 1.14 mmol/L (ref 1.12–1.23)
Chloride: 106 mmol/L (ref 101–111)
Creatinine, Ser: 0.4 mg/dL — ABNORMAL LOW (ref 0.44–1.00)
Glucose, Bld: 99 mg/dL (ref 65–99)
HCT: 32 % — ABNORMAL LOW (ref 36.0–46.0)
Hemoglobin: 10.9 g/dL — ABNORMAL LOW (ref 12.0–15.0)
Potassium: 3.7 mmol/L (ref 3.5–5.1)
Sodium: 137 mmol/L (ref 135–145)
TCO2: 22 mmol/L (ref 0–100)

## 2016-06-04 LAB — TSH: TSH: 0.023 u[IU]/mL — ABNORMAL LOW (ref 0.350–4.500)

## 2016-06-04 LAB — T4, FREE: Free T4: 4.38 ng/dL — ABNORMAL HIGH (ref 0.61–1.12)

## 2016-06-04 MED ORDER — LORAZEPAM 0.5 MG PO TABS
0.5000 mg | ORAL_TABLET | Freq: Once | ORAL | Status: AC
  Administered 2016-06-04: 0.5 mg via ORAL
  Filled 2016-06-04: qty 1

## 2016-06-04 MED ORDER — PROPRANOLOL HCL 10 MG PO TABS
10.0000 mg | ORAL_TABLET | Freq: Once | ORAL | Status: AC
  Administered 2016-06-04: 10 mg via ORAL
  Filled 2016-06-04: qty 1

## 2016-06-04 NOTE — ED Provider Notes (Signed)
CSN: [id number]     Arrival date & time 06/04/16  0719 History   First MD Initiated Contact with Patient 06/04/16 [phone]     Chief Complaint  Patient presents with  . Tachycardia     (Consider location/radiation/quality/duration/timing/severity/associated sxs/prior Treatment) Patient is a 45 y.o. female presenting with palpitations. The history is provided by the patient.  Palpitations Palpitations quality:  Regular Onset quality:  At rest Duration:  3 days Timing:  Constant Progression:  Unchanged Chronicity:  New Context: anxiety (undaer a lot of stress from mother with terminal cancer), caffeine (coffee this morning and green tea) and nicotine (one drag off cigarrette last night)   Context: not appetite suppressants, not bronchodilators and not stimulant use   Relieved by:  Nothing Worsened by:  Nothing Ineffective treatments:  None tried Associated symptoms: chest pain (at night, squeezing pain relieved by aleve)   Associated symptoms: no lower extremity edema, no shortness of breath, no syncope and no vomiting   Risk factors: no hx of DVT and no hx of PE     Past Medical History  Diagnosis Date  . HSV-2 infection   . Hypertension Dx 2013  . Anxiety Dx 2007  . Thyroid disease Dx 2015   Past Surgical History  Procedure Laterality Date  . Bartholin gland cyst excision  10/05  . Cesarean section  1993, 1996    x2  . Tubal ligation  1996   Family History  Problem Relation Age of Onset  . Hyperlipidemia Mother   . Hypertension Mother   . Diabetes Father   . Heart disease Father   . Hypertension Sister    Social History  Substance Use Topics  . Smoking status: Never Smoker   . Smokeless tobacco: Never Used  . Alcohol Use: 0.5 oz/week    1 Standard drinks or equivalent per week     Comment: occasionally   OB History    Gravida Para Term Preterm AB TAB SAB Ectopic Multiple Living   Review of Systems  Respiratory: Negative for shortness of  breath.   Cardiovascular: Positive for chest pain (at night, squeezing pain relieved by aleve) and palpitations. Negative for syncope.  Gastrointestinal: Negative for vomiting.  All other systems reviewed and are negative.     Allergies  Review of patient's allergies indicates no known allergies.  Home Medications   Prior to Admission medications   Medication Sig Start Date End Date Taking? Authorizing Provider  ALPRAZolam (XANAX) 0.25 MG tablet Take 1 tablet (0.25 mg total) by mouth at bedtime as needed for sleep. 12/27/15   Dessa Phi, MD  aspirin EC 81 MG EC tablet Take 1 tablet (81 mg total) by mouth daily. 01/29/15   Belkys A Regalado, MD  ibuprofen (ADVIL,MOTRIN) 800 MG tablet Take 1 tablet (800 mg total) by mouth 3 (three) times daily. 06/30/15   Elwin Mocha, MD  methimazole (TAPAZOLE) 10 MG tablet Take 1 tablet (10 mg total) by mouth 3 (three) times daily. 12/27/15   Dessa Phi, MD  metoprolol tartrate (LOPRESSOR) 25 MG tablet Take 1 tablet (25 mg total) by mouth 2 (two) times daily. 12/27/15   Josalyn Funches, MD   BP 159/92 mmHg  Pulse 121  Temp(Src) 97.9 F (36.6 C) (Oral)  Resp 16  Ht  (1.651 m)  Wt 140 lb (63.504 kg)  BMI 23.30 kg/m2  SpO2 99%  LMP 05/08/2016 Physical Exam  Constitutional:  She is oriented to person, place, and time. She appears well-developed and well-nourished. No distress.  HENT:  Head: Normocephalic.  Eyes: Conjunctivae are normal.  Neck: Neck supple. No tracheal deviation present.  Cardiovascular: Regular rhythm and normal heart sounds.   No extrasystoles are present. Tachycardia present.   Pulmonary/Chest: Effort normal and breath sounds normal. No respiratory distress.  Abdominal: Soft. She exhibits no distension.  Neurological: She is alert and oriented to person, place, and time.  Skin: Skin is warm and dry.  Psychiatric: She has a normal mood and affect.  Vitals reviewed.   ED Course  Procedures (including critical care  time) Labs Review Labs Reviewed  TSH - Abnormal; Notable for the following:    TSH 0.023 (*)    All other components within normal limits  T4, FREE - Abnormal; Notable for the following:    Free T4 4.38 (*)    All other components within normal limits  I-STAT CHEM 8, ED - Abnormal; Notable for the following:    Creatinine, Ser 0.40 (*)    Hemoglobin 10.9 (*)    HCT 32.0 (*)    All other components within normal limits    Imaging Review No results found. I have personally reviewed and evaluated these images and lab results as part of my medical decision-making.   EKG Interpretation   Date/Time:  Wednesday June 04 2016 08:33:22 EDT Ventricular Rate:  117 PR Interval:  144 QRS Duration: 81 QT Interval:  339 QTC Calculation: 473 R Axis:   101 Text Interpretation:  Sinus tachycardia Probable left atrial enlargement  Right axis deviation Consider left ventricular hypertrophy No significant  change since last tracing Confirmed by Nylene Inlow MD, Reuel BoomANIEL [id number]) on  06/04/2016 8:39:08 AM Also confirmed by Keylen Uzelac MD, Avalina Benko [phone]), editor  Dan HumphreysWALKER, CCT, SANDRA ([id number])  on 06/04/2016 9:37:00 AM      MDM   Final diagnoses:  Sinus tachycardia (HCC)  Hyperthyroidism    45 y.o. female presents with concern for noticing tachycardia and increasing stress level. She has known chronic hyperthyroid and has been compliant with methimazole therapy. After discussing with pt who appears very nervous and upset she is taking care of her terminally ill mother and has not been sleeping well, has been cleaning the house non-stop and has been emotionally distressed by her mother's declining condition. She breaks down into tears and then recovers during exam and her HR came down slightly afterward. She tells me this makes her feel better. I suspect a combination of ongoing hyperthyroid and stress. She was provided a small dose of ativan to help in short-term and given a dose of propranolol to help blunt the  hyperthyroid effect. TSH and T4 improved from last available level 5 months ago, no signs of thyroid storm. Plan to follow up with PCP as needed and return precautions discussed for worsening or new concerning symptoms.     Lyndal Pulleyaniel Jatoria Kneeland, MD 06/04/16 [phone]

## 2016-06-04 NOTE — Telephone Encounter (Signed)
Unable to contact pt  Phone unavailable at this time   Pt At ED at this time

## 2016-06-04 NOTE — ED Notes (Signed)
MD aware of patients vital signs.  

## 2016-06-04 NOTE — ED Notes (Signed)
Pt c/o increased heart rate x3 days. Pt reports she is "jittery and anxious". Hx of anxiety and hyperthyroidism.

## 2016-06-04 NOTE — ED Notes (Signed)
MD at bedside. 

## 2016-06-04 NOTE — ED Notes (Signed)
Discharge instructions and follow up care reviewed with patient. Patient verbalized understanding. 

## 2016-06-04 NOTE — Discharge Instructions (Signed)
Hyperthyroidism Hyperthyroidism is when the thyroid is too active (overactive). Your thyroid is a large gland that is located in your neck. The thyroid helps to control how your body uses food (metabolism). When your thyroid is overactive, it produces too much of a hormone called thyroxine.  CAUSES Causes of hyperthyroidism may include:  Graves disease. This is when your immune system attacks the thyroid gland. This is the most common cause.  Inflammation of the thyroid gland.  Tumor in the thyroid gland or somewhere else.  Excessive use of thyroid medicines, including:  Prescription thyroid supplement.  Herbal supplements that mimic thyroid hormones.  Solid or fluid-filled lumps within your thyroid gland (thyroid nodules).  Excessive ingestion of iodine. RISK FACTORS  Being female.  Having a family history of thyroid conditions. SIGNS AND SYMPTOMS Signs and symptoms of hyperthyroidism may include:  Nervousness.  Inability to tolerate heat.  Unexplained weight loss.  Diarrhea.  Change in the texture of hair or skin.  Heart skipping beats or making extra beats.  Rapid heart rate.  Loss of menstruation.  Shaky hands.  Fatigue.  Restlessness.  Increased appetite.  Sleep problems.  Enlarged thyroid gland or nodules. DIAGNOSIS  Diagnosis of hyperthyroidism may include:  Medical history and physical exam.  Blood tests.  Ultrasound tests. TREATMENT Treatment may include:  Medicines to control your thyroid.  Surgery to remove your thyroid.  Radiation therapy. HOME CARE INSTRUCTIONS   Take medicines only as directed by your health care provider.  Do not use any tobacco products, including cigarettes, chewing tobacco, or electronic cigarettes. If you need help quitting, ask your health care provider.  Do not exercise or do physical activity until your health care provider approves.  Keep all follow-up appointments as directed by your health care  provider. This is important. SEEK MEDICAL CARE IF:  Your symptoms do not get better with treatment.  You have fever.  You are taking thyroid replacement medicine and you:  Have depression.  Feel mentally and physically slow.  Have weight gain. SEEK IMMEDIATE MEDICAL CARE IF:   You have decreased alertness or a change in your awareness.  You have abdominal pain.  You feel dizzy.  You have a rapid heartbeat.  You have an irregular heartbeat.   This information is not intended to replace advice given to you by your health care provider. Make sure you discuss any questions you have with your health care provider.   Document Released: 12/15/2005 Document Revised: 01/05/2015 Document Reviewed: 05/02/2014 Elsevier Interactive Patient Education 2016 Elsevier Inc.  

## 2016-06-13 ENCOUNTER — Other Ambulatory Visit: Payer: Self-pay | Admitting: Family Medicine

## 2016-06-16 ENCOUNTER — Other Ambulatory Visit: Payer: Self-pay | Admitting: Family Medicine

## 2016-06-16 NOTE — Telephone Encounter (Signed)
Patient requested a med refill for ALPRAZolam (XANAX) 0.25 MG tablet.  Please f/u with pt.

## 2016-06-17 ENCOUNTER — Telehealth: Payer: Self-pay | Admitting: Family Medicine

## 2016-06-17 MED FILL — ?METOPROLOL 25 MG TABLET: 25 | 30 days supply | Qty: 60 | Fill #2

## 2016-06-17 MED FILL — methIMAzole 10 MG TABS: 10 | 30 days supply | Qty: 90 | Fill #1

## 2016-06-17 NOTE — Telephone Encounter (Signed)
Pt. Called requesting to speak with her PCP.  Pt. Called stating she needs a refill on Xanax and It was denied by her PCP. Pt. Has been trying to schedule An appointment and was told to call back. Please f/u with pt.

## 2016-06-19 ENCOUNTER — Emergency Department (HOSPITAL_COMMUNITY)
Admission: EM | Admit: 2016-06-19 | Discharge: 2016-06-19 | Disposition: A | Discharge status: Home / Self Care | Payer: Self-pay | Attending: Emergency Medicine | Admitting: Emergency Medicine

## 2016-06-19 ENCOUNTER — Encounter (HOSPITAL_COMMUNITY): Payer: Self-pay | Admitting: Emergency Medicine

## 2016-06-19 ENCOUNTER — Emergency Department (HOSPITAL_COMMUNITY): Payer: Self-pay

## 2016-06-19 DIAGNOSIS — Z7982 Long term (current) use of aspirin: Secondary | ICD-10-CM | POA: Insufficient documentation

## 2016-06-19 DIAGNOSIS — Z79899 Other long term (current) drug therapy: Secondary | ICD-10-CM | POA: Insufficient documentation

## 2016-06-19 DIAGNOSIS — I10 Essential (primary) hypertension: Secondary | ICD-10-CM | POA: Insufficient documentation

## 2016-06-19 DIAGNOSIS — R05 Cough: Secondary | ICD-10-CM

## 2016-06-19 DIAGNOSIS — J988 Other specified respiratory disorders: Secondary | ICD-10-CM

## 2016-06-19 DIAGNOSIS — R059 Cough, unspecified: Secondary | ICD-10-CM

## 2016-06-19 DIAGNOSIS — R0789 Other chest pain: Secondary | ICD-10-CM | POA: Insufficient documentation

## 2016-06-19 DIAGNOSIS — B9789 Other viral agents as the cause of diseases classified elsewhere: Secondary | ICD-10-CM

## 2016-06-19 DIAGNOSIS — J069 Acute upper respiratory infection, unspecified: Secondary | ICD-10-CM | POA: Insufficient documentation

## 2016-06-19 MED ORDER — BENZONATATE 100 MG PO CAPS: 100.0000 mg | ORAL_CAPSULE | Freq: Three times a day (TID) | ORAL | Status: DC

## 2016-06-19 NOTE — Progress Notes (Signed)
CM spoke with pt who [place] with Dr Armen Pickupfunches as pcp Pt states she has had difficulty getting in to see Dr Armen PickupFunches States "she is unavailable"   CM discussed and provided written information to assist pt with determining choice for uninsured accepting pcps, discussed the importance of pcp vs EDP services for f/u care, www.needymeds.org, www.goodrx.com, discounted pharmacies and other [place] resources such as [facility] , [facility], affordable care act, financial assistance, uninsured dental services, [place] [facility] assist, DSS and  health department  Reviewed resources for [place] uninsured accepting pcps like [facility], [facility] medicine [facility] [address], [facility], [facility], local urgent care centers, [facility], [facility], general medical clinics, [facility], [facility] plus others, medication resources, [facility] out [facility]ies and housing Pt voiced understanding and appreciation of resources provided Pt states she is glad to have options for other uninsured providers  Provided [facility] contact information

## 2016-06-19 NOTE — ED Provider Notes (Signed)
CSN: [id number]     Arrival date & time 06/19/16  1459 History  By signing my name below, I, Donna Ray [id number] Ray, attest that this documentation has been prepared under the direction and in the presence of General MillsBenjamin Monette Omara, PA-C.   Electronically Signed: [name] Donna Ray, ED Scribe. 06/19/2016. 4:14 PM.   Chief Complaint  Patient presents with  . Cough   The history is provided by the patient. No language interpreter was used.   HPI Comments: Donna MannersMarlo Ray is a 45 y.o. female with a PMHx significant for HTN, anxiety, and hyperthyroidism disease who presents to the Emergency Department complaining of gradual onset, gradually worsening, persistent, productive cough with yellow sputum onset 2-3 days ago. Pt has associated chest tightness and chest pain only secondary to her cough. She also has rhinorrhea, and night sweats. She states that her cough is worsened at night. No OTC medications or home remedies tried PTA for her symptoms. Pt states that [facility], but was unable to obtain an appointment with them which led her to come into the ED. Pt is compliantly taking Methimazole and Metoprolol for her hyperthyroidism, and is not complaining of any issues with them currently. Pt denies fever, SOB, nausea, vomiting, leg swelling, eye discharge, ear pain, hemoptysis, or abdominal pain, leg swelling. No history of DVT, blood clots, or asthma. Pt does not take BCP.   Past Medical History  Diagnosis Date  . HSV-2 infection   . Hypertension Dx 2013  . Anxiety Dx 2007  . Thyroid disease Dx 2015   Past Surgical History  Procedure Laterality Date  . Bartholin gland cyst excision  10/05  . Cesarean section  1993, 1996    x2  . Tubal ligation  1996   Family History  Problem Relation Age of Onset  . Hyperlipidemia Mother   . Hypertension Mother   . Diabetes Father   . Heart disease Father   . Hypertension Sister    Social History   Substance Use Topics  . Smoking status: Never Smoker   . Smokeless tobacco: Never Used  . Alcohol Use: 0.5 oz/week    1 Standard drinks or equivalent per week     Comment: occasionally   OB History    Gravida Para Term Preterm AB TAB SAB Ectopic Multiple Living   3 2        2      Review of Systems A complete 10 system review of systems was obtained and all systems are negative except as noted in the HPI and PMH.   Allergies  Review of patient's allergies indicates no known allergies.  Home Medications   Prior to Admission medications   Medication Sig Start Date End Date Taking? Authorizing Provider  ALPRAZolam (XANAX) 0.25 MG tablet Take 1 tablet (0.25 mg total) by mouth at bedtime as needed for sleep. 12/27/15   Dessa PhiJosalyn Funches, MD  aspirin EC 81 MG EC tablet Take 1 tablet (81 mg total) by mouth daily. 01/29/15   Belkys A Regalado, MD  BIOTIN PO Take 1 tablet by mouth daily.    Historical Provider, MD  methimazole (TAPAZOLE) 10 MG tablet Take 1 tablet (10 mg total) by mouth 3 (three) times daily. 12/27/15   Dessa PhiJosalyn Funches, MD  metoprolol tartrate (LOPRESSOR) 25 MG tablet Take 1 tablet (25 mg total) by mouth 2 (two) times daily. 12/27/15   Josalyn Funches, MD  naproxen sodium (ANAPROX) 220 MG tablet Take 220-880 mg by  mouth 2 (two) times daily as needed (pain).    Historical Provider, MD   BP 157/78 mmHg  Pulse 102  Temp(Src) 98.1 F (36.7 C) (Oral)  Resp 18  SpO2 100%  LMP 06/06/2016   Physical Exam  Constitutional: She is oriented to person, place, and time. She appears well-developed and well-nourished.  HENT:  Head: Normocephalic and atraumatic.  Mouth/Throat: Oropharynx is clear and moist.  Eyes: Conjunctivae are normal. Pupils are equal, round, and reactive to light. Right eye exhibits no discharge. Left eye exhibits no discharge. No scleral icterus.  Neck: Neck supple.  Cardiovascular: Normal rate, regular rhythm and normal heart sounds.   No murmur heard. Her HR  is in the mid-90s.  Pulmonary/Chest: Effort normal and breath sounds normal. No respiratory distress. She has no wheezes. She has no rales.  Abdominal: Soft. She exhibits no distension. There is no tenderness.  Musculoskeletal: Normal range of motion. She exhibits no edema or tenderness.  Neurological: She is alert and oriented to person, place, and time.  Cranial Nerves II-XII grossly intact  Skin: Skin is warm and dry. No rash noted.  Psychiatric: She has a normal mood and affect. Her behavior is normal.  Nursing note and vitals reviewed.   ED Course  Procedures (including critical care time)  DIAGNOSTIC STUDIES: Oxygen Saturation is 100% on RA, normal by my interpretation.   COORDINATION OF CARE: 4:00 PM-Discussed next steps with pt including DG Chest. Pt verbalized understanding and is agreeable with the plan.   Imaging Review Dg Chest 2 View  06/19/2016  CLINICAL DATA:  Chronic cough.  Yellow phlegm for 1 week. EXAM: CHEST  2 VIEW COMPARISON:  06/30/2015 FINDINGS: The heart size appears normal. No pleural effusion or edema. No airspace consolidation identified. The visualized bony structures are unremarkable. IMPRESSION: 1. No acute cardiopulmonary abnormalities. Electronically Signed   By: Signa Kellaylor  Stroud M.D.   On: 06/19/2016 15:57   I have personally reviewed and evaluated these images as part of my medical decision-making.   MDM  Pt CXR negative for acute infiltrate. Patients symptoms are consistent with URI, likely viral etiology. Discussed that antibiotics are not indicated for viral infections. Pt will be discharged with symptomatic treatment.  Verbalizes understanding and is agreeable with plan. Pt is hemodynamically stable & in NAD prior to dc.  Final diagnoses:  Cough  Viral respiratory illness        Joycie PeekBenjamin Kallon Caylor, PA-C 06/19/16 1628  Dione Boozeavid Glick, MD 06/19/16 [phone]

## 2016-06-19 NOTE — ED Notes (Addendum)
Patient presents for cough and chest tightness x2-3 days. Denies fever, SOB, N/V. History of anxiety.

## 2016-06-19 NOTE — Discharge Instructions (Signed)
There is not appear to be an emergent cause for your symptoms at this time. Your exam and x-rays were very reassuring. Take your medication as prescribed to help with your cough. Follow-up with your doctor as needed. Return to ED for any new or worsening symptoms.  Viral Infections A viral infection can be caused by different types of viruses.Most viral infections are not serious and resolve on their own. However, some infections may cause severe symptoms and may lead to further complications. SYMPTOMS Viruses can frequently cause:  Minor sore throat.  Aches and pains.  Headaches.  Runny nose.  Different types of rashes.  Watery eyes.  Tiredness.  Cough.  Loss of appetite.  Gastrointestinal infections, resulting in nausea, vomiting, and diarrhea. These symptoms do not respond to antibiotics because the infection is not caused by bacteria. However, you might catch a bacterial infection following the viral infection. This is sometimes called a "superinfection." Symptoms of such a bacterial infection may include:  Worsening sore throat with pus and difficulty swallowing.  Swollen neck glands.  Chills and a high or persistent fever.  Severe headache.  Tenderness over the sinuses.  Persistent overall ill feeling (malaise), muscle aches, and tiredness (fatigue).  Persistent cough.  Yellow, green, or brown mucus production with coughing. HOME CARE INSTRUCTIONS   Only take over-the-counter or prescription medicines for pain, discomfort, diarrhea, or fever as directed by your caregiver.  Drink enough water and fluids to keep your urine clear or pale yellow. Sports drinks can provide valuable electrolytes, sugars, and hydration.  Get plenty of rest and maintain proper nutrition. Soups and broths with crackers or rice are fine. SEEK IMMEDIATE MEDICAL CARE IF:   You have severe headaches, shortness of breath, chest pain, neck pain, or an unusual rash.  You have uncontrolled  vomiting, diarrhea, or you are unable to keep down fluids.  You or your child has an oral temperature above 102 F (38.9 C), not controlled by medicine.  Your baby is older than 3 months with a rectal temperature of 102 F (38.9 C) or higher.  Your baby is 203 months old or younger with a rectal temperature of 100.4 F (38 C) or higher. MAKE SURE YOU:   Understand these instructions.  Will watch your condition.  Will get help right away if you are not doing well or get worse.   This information is not intended to replace advice given to you by your health care provider. Make sure you discuss any questions you have with your health care provider.   Document Released: 09/24/2005 Document Revised: 03/08/2012 Document Reviewed: 05/23/2015 Elsevier Interactive Patient Education [employer]2016 Elsevier Inc.

## 2016-07-07 ENCOUNTER — Ambulatory Visit: Payer: Self-pay | Attending: Family Medicine | Admitting: Family Medicine

## 2016-07-07 ENCOUNTER — Encounter: Payer: Self-pay | Admitting: Family Medicine

## 2016-07-07 VITALS — BP 136/64 | HR 114 | Temp 98.4°F | Resp 18 | Ht 65.0 in | Wt 131.0 lb

## 2016-07-07 DIAGNOSIS — I10 Essential (primary) hypertension: Secondary | ICD-10-CM

## 2016-07-07 DIAGNOSIS — G4700 Insomnia, unspecified: Secondary | ICD-10-CM

## 2016-07-07 DIAGNOSIS — E0590 Thyrotoxicosis, unspecified without thyrotoxic crisis or storm: Secondary | ICD-10-CM

## 2016-07-07 LAB — T4, FREE: FREE T4: 3.1 ng/dL — AB (ref 0.8–1.8)

## 2016-07-07 LAB — TSH: TSH: 0.01 m[IU]/L — AB

## 2016-07-07 LAB — T3, FREE: T3, Free: 16.2 pg/mL — ABNORMAL HIGH (ref 2.3–4.2)

## 2016-07-07 MED ORDER — METHIMAZOLE 10 MG PO TABS: 10.0000 mg | ORAL_TABLET | Freq: Three times a day (TID) | ORAL | Status: DC

## 2016-07-07 MED ORDER — ALPRAZOLAM 0.25 MG PO TABS: 0.2500 mg | ORAL_TABLET | Freq: Every evening | ORAL | Status: DC | PRN

## 2016-07-07 MED ORDER — METOPROLOL TARTRATE 25 MG PO TABS: 25.0000 mg | ORAL_TABLET | Freq: Two times a day (BID) | ORAL | Status: DC

## 2016-07-07 NOTE — Patient Instructions (Signed)
Donna Ray was seen today for hyperthyroidism.  Diagnoses and all orders for this visit:  Hyperthyroidism -     TSH -     T3, Free -     T4, free -     methimazole (TAPAZOLE) 10 MG tablet; Take 1 tablet (10 mg total) by mouth 3 (three) times daily. -     ALPRAZolam (XANAX) 0.25 MG tablet; Take 1 tablet (0.25 mg total) by mouth at bedtime as needed for sleep.  Essential hypertension -     metoprolol tartrate (LOPRESSOR) 25 MG tablet; Take 1 tablet (25 mg total) by mouth 2 (two) times daily.   You will be called with lab results  F/u in 3-4 weeks for pap smear  Dr. Armen PickupFunches

## 2016-07-07 NOTE — Progress Notes (Signed)
Subjective:  Patient ID: Donna Ray, female    DOB: 09/18/1971  Age: 45 y.o. MRN: [id number]  CC: Hyperthyroidism   HPI Donna Ray presents for   1. Hyperthyroidism: she is compliant with methimazole. She is  having tremor, anxiety, palpitations, dry eyes and trouble sleeping. She has recent stressor of her mother having stage 4 lung cancer, dementia, on hospice, custody dispute with estranged sister. 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 She denies CP and syncope.   2. HTN: no toprol. No HA, CP, SOB or swelling.  Social History  Substance Use Topics  . Smoking status: Never Smoker   . Smokeless tobacco: Never Used  . Alcohol Use: 0.5 oz/week    1 Standard drinks or equivalent per week     Comment: occasionally    Outpatient Prescriptions Prior to Visit  Medication Sig Dispense Refill  . ALPRAZolam (XANAX) 0.25 MG tablet Take 1 tablet (0.25 mg total) by mouth at bedtime as needed for sleep. 30 tablet 0  . aspirin EC 81 MG EC tablet Take 1 tablet (81 mg total) by mouth daily. 30 tablet 0  . benzonatate (TESSALON) 100 MG capsule Take 1 capsule (100 mg total) by mouth every 8 (eight) hours. 21 capsule 0  . BIOTIN PO Take 1 tablet by mouth daily.    . methimazole (TAPAZOLE) 10 MG tablet Take 1 tablet (10 mg total) by mouth 3 (three) times daily. 90 tablet 5  . metoprolol tartrate (LOPRESSOR) 25 MG tablet Take 1 tablet (25 mg total) by mouth 2 (two) times daily. 60 tablet 2  . naproxen sodium (ANAPROX) 220 MG tablet Take 220-880 mg by mouth 2 (two) times daily as needed (pain).     No facility-administered medications prior to visit.    ROS Review of Systems  Constitutional: Negative for fever and chills.  Eyes: Negative for visual disturbance.  Respiratory: Negative for shortness of breath.   Cardiovascular: Positive for palpitations. Negative for chest pain.  Gastrointestinal: Negative for abdominal pain and blood in stool.  Musculoskeletal: Negative for  back pain and arthralgias.  Skin: Negative for rash.  Allergic/Immunologic: Negative for immunocompromised state.  Neurological: Positive for tremors.  Hematological: Negative for adenopathy. Does not bruise/bleed easily.  Psychiatric/Behavioral: Positive for sleep disturbance. Negative for suicidal ideas and dysphoric mood. The patient is nervous/anxious.     Objective:  BP 151/69 mmHg  Pulse 114  Temp(Src) 98.4 F (36.9 C) (Oral)  Resp 18  Ht 5\' 5"  (1.651 m)  Wt 131 lb (59.421 kg)  BMI 21.80 kg/m2  SpO2 100%  LMP 06/29/2016  BP/Weight 07/07/2016 06/19/2016 06/04/2016  Systolic BP 151 161 144  Diastolic BP 69 84 78  Wt. (Lbs) 131 - 140  BMI 21.8 - 23.3   Manual pulse 120  Physical Exam  Constitutional: She is oriented to person, place, and time. She appears well-developed and well-nourished. No distress.  HENT:  Head: Normocephalic and atraumatic.  Eyes: Conjunctivae and EOM are normal. Pupils are equal, round, and reactive to light.  Exophthalmus b/l    Neck: Thyromegaly present.  Cardiovascular: Regular rhythm, normal heart sounds and intact distal pulses.  Tachycardia present.   Pulmonary/Chest: Effort normal and breath sounds normal.  Musculoskeletal: She exhibits no edema.  Neurological: She is alert and oriented to person, place, and time. She displays tremor.  Skin: Skin is warm and dry. No rash noted.  Psychiatric: She has a normal mood and affect.   Lab Results  Component Value Date  TSH 0.01* 07/07/2016    Assessment & Plan:   Problem List Items Addressed This Visit    Hyperthyroidism - Primary (Chronic)   Relevant Medications   methimazole (TAPAZOLE) 10 MG tablet   metoprolol tartrate (LOPRESSOR) 25 MG tablet   ALPRAZolam (XANAX) 0.25 MG tablet   Other Relevant Orders   TSH (Completed)   T3, Free (Completed)   T4, free (Completed)   HTN (hypertension) (Chronic)   Relevant Medications   metoprolol tartrate (LOPRESSOR) 25 MG tablet      No orders  of the defined types were placed in this encounter.    Follow-up: No Follow-up on file.   Dessa Phi MD

## 2016-07-08 ENCOUNTER — Telehealth: Payer: Self-pay | Admitting: Family Medicine

## 2016-07-08 DIAGNOSIS — E0590 Thyrotoxicosis, unspecified without thyrotoxic crisis or storm: Secondary | ICD-10-CM

## 2016-07-08 DIAGNOSIS — G4700 Insomnia, unspecified: Secondary | ICD-10-CM | POA: Insufficient documentation

## 2016-07-08 DIAGNOSIS — F064 Anxiety disorder due to known physiological condition: Secondary | ICD-10-CM

## 2016-07-08 MED ORDER — METOPROLOL TARTRATE 50 MG PO TABS: 50.0000 mg | ORAL_TABLET | Freq: Two times a day (BID) | ORAL | Status: DC

## 2016-07-08 MED ORDER — METHIMAZOLE 10 MG PO TABS: 20.0000 mg | ORAL_TABLET | Freq: Three times a day (TID) | ORAL | Status: DC

## 2016-07-08 NOTE — Assessment & Plan Note (Signed)
A: hyperthyroidism uncontrolled P: Increase methimazole to 20 mg TID from 10 mg TID

## 2016-07-08 NOTE — Telephone Encounter (Signed)
Will forward to pcp

## 2016-07-08 NOTE — Telephone Encounter (Signed)
Patient called requesting to try to switch anxiety medication to valium, pt states PCP offered to try this medication. Patient states medication she is on now is not helping she feels "tingling" in fingers and back. Please f/up

## 2016-07-08 NOTE — Assessment & Plan Note (Signed)
Secondary to hyperthyroidism  Refilled xanax

## 2016-07-08 NOTE — Assessment & Plan Note (Signed)
A: HTN in setting of hyperthyroidism P: Increase metoprolol to 50 mg BID from 25 mg BID

## 2016-07-09 ENCOUNTER — Telehealth: Payer: Self-pay

## 2016-07-09 NOTE — Telephone Encounter (Signed)
Contacted patient to go over lab results patient did not answer lvm for patient to give me a call at her earliest convenience

## 2016-07-10 MED ORDER — DIAZEPAM 5 MG PO TABS: 5.0000 mg | ORAL_TABLET | Freq: Two times a day (BID) | ORAL | Status: DC | PRN

## 2016-07-10 NOTE — Telephone Encounter (Signed)
Valium ordered and ready for pick up  Patient should not take valium and xanax due to risk of respiratory sedation, breathing difficulty.  Patient also needs to increase methimazole to the 15 mg TID and metoprolol to 50 mg BID if she has not already done so

## 2016-07-10 NOTE — Telephone Encounter (Signed)
Contacted pt to to inform her that her rx was ready for pick up and give her Dr. Armen PickupFunches advice pt did not answer lvm

## 2016-07-11 ENCOUNTER — Telehealth: Payer: Self-pay

## 2016-07-11 ENCOUNTER — Telehealth: Payer: Self-pay | Admitting: Family Medicine

## 2016-07-11 NOTE — Telephone Encounter (Signed)
Pt returned call from yesterday in regards to her results

## 2016-07-11 NOTE — Telephone Encounter (Signed)
Returned patients call and went over lab results. Patient is aware to increase Methimazole to 20mg  3 times a day. Pt doesn't have any questions or concerns

## 2016-07-29 ENCOUNTER — Other Ambulatory Visit: Payer: Self-pay | Admitting: Family Medicine

## 2016-07-29 DIAGNOSIS — F064 Anxiety disorder due to known physiological condition: Secondary | ICD-10-CM

## 2016-07-29 DIAGNOSIS — E0590 Thyrotoxicosis, unspecified without thyrotoxic crisis or storm: Secondary | ICD-10-CM

## 2016-07-29 MED FILL — methIMAzole 10 MG TABS: 10 | 30 days supply | Qty: 90 | Fill #2

## 2016-07-31 NOTE — Telephone Encounter (Signed)
Will forward to PCP 

## 2016-07-31 NOTE — Telephone Encounter (Signed)
Requesting denied. Was last prescribed on 07/10/16 by PCP and she will need to wait till 08/10/16 to obtain a new refill by PCP.

## 2016-07-31 NOTE — Telephone Encounter (Signed)
Pt calling requesting refill of diazepam (VALIUM) 5 MG tablet  Pt states she will be out of medication by the end of the day. Pt also states she has concerns with this Rx because it is not helping. Pt states she is still not sleeping at night even though she takes the Valium and four Xanax at night

## 2016-08-01 ENCOUNTER — Telehealth: Payer: Self-pay

## 2016-08-01 NOTE — Telephone Encounter (Signed)
Left message on patient voicemail for her to return phone call about her prescription.

## 2016-08-04 MED ORDER — DIAZEPAM 10 MG PO TABS: 10.0000 mg | ORAL_TABLET | Freq: Two times a day (BID) | ORAL | 0 refills | Status: DC | PRN

## 2016-08-04 MED ORDER — DIAZEPAM 5 MG PO TABS: 5.0000 mg | ORAL_TABLET | Freq: Two times a day (BID) | ORAL | 2 refills | Status: DC | PRN

## 2016-08-04 NOTE — Telephone Encounter (Signed)
Please inform patient that a higher dose of valium has been prescribed 10 mg  Rx ready for pick up

## 2016-08-04 NOTE — Addendum Note (Signed)
Addended by: Dessa PhiFUNCHES, Ambrose Wile on: 08/04/2016 08:50 AM   Modules accepted: Orders

## 2016-08-07 ENCOUNTER — Other Ambulatory Visit: Payer: Self-pay | Admitting: Family Medicine

## 2016-08-11 ENCOUNTER — Telehealth: Payer: Self-pay | Admitting: Family Medicine

## 2016-08-11 ENCOUNTER — Other Ambulatory Visit: Payer: Self-pay | Admitting: Family Medicine

## 2016-08-11 DIAGNOSIS — K029 Dental caries, unspecified: Secondary | ICD-10-CM

## 2016-08-11 NOTE — Telephone Encounter (Signed)
Pt. Called requesting to be referred out to the dentist b/c she has a cavity Please f/u with pt.

## 2016-08-12 ENCOUNTER — Telehealth: Payer: Self-pay | Admitting: Family Medicine

## 2016-08-12 DIAGNOSIS — E0590 Thyrotoxicosis, unspecified without thyrotoxic crisis or storm: Secondary | ICD-10-CM

## 2016-08-12 DIAGNOSIS — F064 Anxiety disorder due to known physiological condition: Secondary | ICD-10-CM

## 2016-08-12 MED ORDER — DIAZEPAM 10 MG PO TABS: 10.0000 mg | ORAL_TABLET | Freq: Two times a day (BID) | ORAL | 2 refills | Status: DC | PRN

## 2016-08-12 NOTE — Telephone Encounter (Signed)
Patient needs a refill on diazepam. Please follow up.

## 2016-08-12 NOTE — Telephone Encounter (Signed)
Went to med sign out book to discard printed Rx for valium and there was notation that the Rx had already been signed out. Please call back to patient to clarify.

## 2016-08-12 NOTE — Telephone Encounter (Signed)
Phone in refill for 10 mg diazepam to patient's [facility]. Discarded the Rx placed up front for pick up Patient will be called by [facility] for med pick up

## 2016-08-12 NOTE — Telephone Encounter (Signed)
Pt called is aware medication is upfront ready for pick up, pt would like to know if we are able to fax rx to her. Please f/up

## 2016-08-12 NOTE — Telephone Encounter (Signed)
Dental referral placed.

## 2016-08-13 ENCOUNTER — Telehealth: Payer: Self-pay

## 2016-08-13 NOTE — Telephone Encounter (Signed)
Patient was called about prescription but patient already filled the medication. She states she has an office visit tomorrow.

## 2016-08-13 NOTE — Telephone Encounter (Signed)
Patient was called and informed of dental referral.

## 2016-08-14 ENCOUNTER — Other Ambulatory Visit: Payer: Self-pay | Admitting: Family Medicine

## 2016-08-18 ENCOUNTER — Other Ambulatory Visit: Payer: Self-pay | Admitting: Family Medicine

## 2016-09-02 ENCOUNTER — Ambulatory Visit: Payer: Self-pay

## 2016-09-09 ENCOUNTER — Other Ambulatory Visit: Payer: Self-pay | Admitting: Family Medicine

## 2016-09-09 DIAGNOSIS — I10 Essential (primary) hypertension: Secondary | ICD-10-CM

## 2016-09-09 MED FILL — methIMAzole 10 MG TABS: 10 | 30 days supply | Qty: 90 | Fill #3

## 2016-09-09 MED FILL — ?METOPROLOL 25 MG TABLET: 25 | 30 days supply | Qty: 60 | Fill #0

## 2016-09-10 ENCOUNTER — Ambulatory Visit: Payer: Self-pay | Attending: Family Medicine

## 2016-09-13 ENCOUNTER — Other Ambulatory Visit: Payer: Self-pay | Admitting: Family Medicine

## 2016-09-13 DIAGNOSIS — F064 Anxiety disorder due to known physiological condition: Secondary | ICD-10-CM

## 2016-09-13 DIAGNOSIS — E0590 Thyrotoxicosis, unspecified without thyrotoxic crisis or storm: Secondary | ICD-10-CM

## 2016-09-16 NOTE — Telephone Encounter (Signed)
Upon review of Controlled Substance Reporting System - patient has recently received alprazolam. Notified Dr. Armen PickupFunches.

## 2016-09-16 NOTE — Telephone Encounter (Signed)
Please note patient prescribed 60 valium with 2 refills for 3 month supply on 08/12/16

## 2016-09-18 ENCOUNTER — Ambulatory Visit: Payer: Self-pay | Attending: Family Medicine | Admitting: Family Medicine

## 2016-09-18 ENCOUNTER — Encounter (INDEPENDENT_AMBULATORY_CARE_PROVIDER_SITE_OTHER): Payer: Self-pay

## 2016-09-18 ENCOUNTER — Encounter: Payer: Self-pay | Admitting: Family Medicine

## 2016-09-18 VITALS — BP 132/77 | HR 84 | Temp 98.2°F | Ht 65.0 in | Wt 128.6 lb

## 2016-09-18 DIAGNOSIS — E0590 Thyrotoxicosis, unspecified without thyrotoxic crisis or storm: Secondary | ICD-10-CM | POA: Insufficient documentation

## 2016-09-18 DIAGNOSIS — M545 Low back pain, unspecified: Secondary | ICD-10-CM | POA: Insufficient documentation

## 2016-09-18 DIAGNOSIS — F064 Anxiety disorder due to known physiological condition: Secondary | ICD-10-CM

## 2016-09-18 DIAGNOSIS — Z7982 Long term (current) use of aspirin: Secondary | ICD-10-CM | POA: Insufficient documentation

## 2016-09-18 DIAGNOSIS — Z79899 Other long term (current) drug therapy: Secondary | ICD-10-CM | POA: Insufficient documentation

## 2016-09-18 DIAGNOSIS — I340 Nonrheumatic mitral (valve) insufficiency: Secondary | ICD-10-CM | POA: Insufficient documentation

## 2016-09-18 DIAGNOSIS — F419 Anxiety disorder, unspecified: Secondary | ICD-10-CM | POA: Insufficient documentation

## 2016-09-18 DIAGNOSIS — B9689 Other specified bacterial agents as the cause of diseases classified elsewhere: Secondary | ICD-10-CM

## 2016-09-18 DIAGNOSIS — F418 Other specified anxiety disorders: Secondary | ICD-10-CM

## 2016-09-18 DIAGNOSIS — Z124 Encounter for screening for malignant neoplasm of cervix: Secondary | ICD-10-CM | POA: Insufficient documentation

## 2016-09-18 DIAGNOSIS — A499 Bacterial infection, unspecified: Secondary | ICD-10-CM

## 2016-09-18 DIAGNOSIS — Z23 Encounter for immunization: Secondary | ICD-10-CM

## 2016-09-18 DIAGNOSIS — N760 Acute vaginitis: Secondary | ICD-10-CM

## 2016-09-18 LAB — T4, FREE: FREE T4: 0.7 ng/dL — AB (ref 0.8–1.8)

## 2016-09-18 LAB — T3, FREE: T3 FREE: 5.2 pg/mL — AB (ref 2.3–4.2)

## 2016-09-18 MED ORDER — METHIMAZOLE 10 MG PO TABS: 20.0000 mg | ORAL_TABLET | Freq: Three times a day (TID) | ORAL | 5 refills | Status: DC

## 2016-09-18 MED ORDER — DIAZEPAM 10 MG PO TABS: 10.0000 mg | ORAL_TABLET | Freq: Two times a day (BID) | ORAL | 2 refills | Status: DC | PRN

## 2016-09-18 NOTE — Assessment & Plan Note (Signed)
MSK pain in setting of hyperthyroidism Plan: NSAID, heat, ice

## 2016-09-18 NOTE — Progress Notes (Signed)
Pain in tailbone for 2 days.  Pt is getting flu shot today.

## 2016-09-18 NOTE — Patient Instructions (Addendum)
Donna Ray was seen today for gynecologic exam.  Diagnoses and all orders for this visit:  Pap smear for cervical cancer screening -     Cytology - PAP  Hyperthyroidism -     diazepam (VALIUM) 10 MG tablet; Take 1 tablet (10 mg total) by mouth every 12 (twelve) hours as needed for anxiety. -     T3 -     T4, free -     T3, Free -     methimazole (TAPAZOLE) 10 MG tablet; Take 2 tablets (20 mg total) by mouth 3 (three) times daily. -     Ambulatory referral to Endocrinology  Anxiety disorder due to medical condition -     diazepam (VALIUM) 10 MG tablet; Take 1 tablet (10 mg total) by mouth every 12 (twelve) hours as needed for anxiety.  Bilateral low back pain without sciatica  Mitral regurgitation   Be sure to take tapazole 20 mg three times daily   Dr. Armen PickupFunches

## 2016-09-18 NOTE — Progress Notes (Signed)
Subjective:  Patient ID: Donna Ray, female    DOB: 09/07/1971  Age: 45 y.o. MRN: [id number]  CC: Gynecologic Exam   HPI Donna Ray presents for    1. Pap smear: due for screening. Coming to end of period. No pain with intercourse.  2. Hyperthyroidism: taking methimazole 10 mg TID right now, she forgot that she was suppose to take 20 mg TID. She still has muscle aches in thighs and back. No CP, palpitations, shortness of breath.   Social History  Substance Use Topics  . Smoking status: Never Smoker  . Smokeless tobacco: Never Used  . Alcohol use 0.5 oz/week    1 Standard drinks or equivalent per week     Comment: occasionally    Outpatient Medications Prior to Visit  Medication Sig Dispense Refill  . aspirin EC 81 MG EC tablet Take 1 tablet (81 mg total) by mouth daily. 30 tablet 0  . BIOTIN PO Take 1 tablet by mouth daily.    . diazepam (VALIUM) 10 MG tablet Take 1 tablet (10 mg total) by mouth every 12 (twelve) hours as needed for anxiety. 60 tablet 2  . methimazole (TAPAZOLE) 10 MG tablet Take 2 tablets (20 mg total) by mouth 3 (three) times daily. 180 tablet 5  . metoprolol tartrate (LOPRESSOR) 25 MG tablet TAKE 1 TABLET BY MOUTH TWICE DAILY. 60 tablet 2  . metoprolol tartrate (LOPRESSOR) 50 MG tablet Take 1 tablet (50 mg total) by mouth 2 (two) times daily. (Patient not taking: Reported on 09/18/2016) 60 tablet 5  . naproxen sodium (ANAPROX) 220 MG tablet Take 220-880 mg by mouth 2 (two) times daily as needed (pain).     No facility-administered medications prior to visit.     ROS Review of Systems  Constitutional: Negative for chills and fever.  Eyes: Negative for visual disturbance.  Respiratory: Negative for shortness of breath.   Cardiovascular: Negative for chest pain.  Gastrointestinal: Negative for abdominal pain and blood in stool.  Musculoskeletal: Positive for back pain (pain in tailbone ). Negative for arthralgias.  Skin: Negative for rash.    Allergic/Immunologic: Negative for immunocompromised state.  Hematological: Negative for adenopathy. Does not bruise/bleed easily.  Psychiatric/Behavioral: Negative for dysphoric mood and suicidal ideas.    Objective:  BP 132/77 (BP Location: Left Arm, Patient Position: Sitting, Cuff Size: Small)   Pulse 84   Temp 98.2 F (36.8 C) (Oral)   Ht 5\' 5"  (1.651 m)   Wt 128 lb 9.6 oz (58.3 kg)   LMP 09/13/2016   SpO2 100%   BMI 21.40 kg/m   BP/Weight 09/18/2016 07/07/2016 06/19/2016  Systolic BP 132 136 161  Diastolic BP 77 64 84  Wt. (Lbs) 128.6 131 -  BMI 21.4 21.8 -   Physical Exam  Constitutional: She appears well-developed and well-nourished. No distress.  Cardiovascular: Normal rate, regular rhythm, normal heart sounds and intact distal pulses.   Pulmonary/Chest: Effort normal and breath sounds normal.  Genitourinary: Uterus normal. Pelvic exam was performed with patient prone. There is no rash, tenderness or lesion on the right labia. There is no rash, tenderness or lesion on the left labia. Cervix exhibits no motion tenderness, no discharge and no friability. There is bleeding in the vagina.  Musculoskeletal: She exhibits no edema.       Lumbar back: She exhibits tenderness.       Back:  Lymphadenopathy:       Right: No inguinal adenopathy present.       Left:  No inguinal adenopathy present.  Skin: Skin is warm and dry. No rash noted.     Assessment & Plan:  Donna Ray was seen today for gynecologic exam.  Diagnoses and all orders for this visit:  Pap smear for cervical cancer screening -     Cytology - PAP  Hyperthyroidism -     diazepam (VALIUM) 10 MG tablet; Take 1 tablet (10 mg total) by mouth every 12 (twelve) hours as needed for anxiety. -     T3 -     T4, free -     T3, Free -     methimazole (TAPAZOLE) 10 MG tablet; Take 2 tablets (20 mg total) by mouth 3 (three) times daily. -     Ambulatory referral to Endocrinology  Anxiety disorder due to medical  condition -     diazepam (VALIUM) 10 MG tablet; Take 1 tablet (10 mg total) by mouth every 12 (twelve) hours as needed for anxiety.  Bilateral low back pain without sciatica  Mitral regurgitation   There are no diagnoses linked to this encounter.  No orders of the defined types were placed in this encounter.   Follow-up: Return in about 2 months (around 11/18/2016) for hyperthyroidism.   Dessa Phi MD

## 2016-09-18 NOTE — Assessment & Plan Note (Addendum)
A: hyperthyroidism P: Check TFTs Continue methimazole Continue metoprolol Continue valium  Endocrinology referral

## 2016-09-19 ENCOUNTER — Telehealth: Payer: Self-pay

## 2016-09-19 LAB — T3: T3, Total: 225 ng/dL — ABNORMAL HIGH (ref 76–181)

## 2016-09-19 LAB — CYTOLOGY - PAP

## 2016-09-19 LAB — CERVICOVAGINAL ANCILLARY ONLY: WET PREP (BD AFFIRM): POSITIVE — AB

## 2016-09-19 MED ORDER — METRONIDAZOLE 500 MG PO TABS: 500.0000 mg | ORAL_TABLET | Freq: Two times a day (BID) | ORAL | 0 refills | Status: DC

## 2016-09-19 MED ORDER — FLUCONAZOLE 150 MG PO TABS: 150.0000 mg | ORAL_TABLET | Freq: Once | ORAL | 0 refills | Status: AC

## 2016-09-19 NOTE — Addendum Note (Signed)
Addended by: Dessa PhiFUNCHES, Braden Cimo on: 09/19/2016 05:49 PM   Modules accepted: Orders

## 2016-09-19 NOTE — Telephone Encounter (Signed)
-----   Message from Dessa PhiJosalyn Funches, MD sent at 09/19/2016 10:29 AM EDT ----- Thyroid function test have improved significantly T4 is a bit low and T3 is a bit elevated Continue current dose of methimazole 20 mg three times a day

## 2016-09-19 NOTE — Telephone Encounter (Signed)
Pt was called and informed of results on 9/22.

## 2016-09-24 ENCOUNTER — Telehealth: Payer: Self-pay

## 2016-09-24 NOTE — Telephone Encounter (Signed)
Pt was called and informed of lab results on 9/27 and informed of medication being sent to the pharmacy.

## 2016-10-02 MED FILL — metroNIDAZOLE 500 MG TABS: 500 | 7 days supply | Qty: 14 | Fill #0

## 2016-10-02 MED FILL — FLUCONAZOLE 150 MG TABLET: 150 | 1 days supply | Qty: 1 | Fill #0

## 2016-11-05 MED FILL — ?METOPROLOL 25 MG TABLET: 25 | 30 days supply | Qty: 60 | Fill #1

## 2016-11-05 MED FILL — methIMAzole 10 MG TABS: 10 | 30 days supply | Qty: 90 | Fill #4

## 2016-11-18 ENCOUNTER — Ambulatory Visit: Payer: Self-pay | Admitting: Family Medicine

## 2016-12-04 ENCOUNTER — Other Ambulatory Visit: Payer: Self-pay | Admitting: Family Medicine

## 2016-12-04 ENCOUNTER — Ambulatory Visit: Payer: Self-pay | Admitting: Family Medicine

## 2016-12-04 ENCOUNTER — Ambulatory Visit: Payer: Self-pay | Attending: Family Medicine

## 2016-12-04 DIAGNOSIS — E0590 Thyrotoxicosis, unspecified without thyrotoxic crisis or storm: Secondary | ICD-10-CM | POA: Insufficient documentation

## 2016-12-04 LAB — T4, FREE: Free T4: 0.4 ng/dL — ABNORMAL LOW (ref 0.8–1.8)

## 2016-12-04 LAB — TSH: TSH: 0.01 mIU/L — ABNORMAL LOW

## 2016-12-04 LAB — T3, FREE: T3, Free: 3.9 pg/mL (ref 2.3–4.2)

## 2016-12-04 NOTE — Progress Notes (Signed)
Patient here for lab visit only 

## 2016-12-05 ENCOUNTER — Telehealth: Payer: Self-pay

## 2016-12-05 MED ORDER — METHIMAZOLE 10 MG PO TABS: 10.0000 mg | ORAL_TABLET | Freq: Three times a day (TID) | ORAL | 2 refills | Status: AC

## 2016-12-05 NOTE — Addendum Note (Signed)
Addended by: Dessa PhiFUNCHES, Verdell Dykman on: 12/05/2016 09:14 AM   Modules accepted: Orders

## 2016-12-05 NOTE — Telephone Encounter (Signed)
Pt was called and informed of lab results and medication changes. 

## 2017-01-01 ENCOUNTER — Ambulatory Visit: Payer: Self-pay | Admitting: Family Medicine

## 2017-01-01 MED FILL — methIMAzole 10 MG TABS: 10 | 30 days supply | Qty: 90 | Fill #0

## 2017-01-09 ENCOUNTER — Encounter: Payer: Self-pay | Admitting: Family Medicine

## 2017-01-09 ENCOUNTER — Ambulatory Visit: Payer: Self-pay | Attending: Family Medicine | Admitting: Family Medicine

## 2017-01-09 VITALS — BP 147/84 | HR 96 | Temp 98.2°F | Resp 16 | Wt 129.2 lb

## 2017-01-09 DIAGNOSIS — Z7982 Long term (current) use of aspirin: Secondary | ICD-10-CM | POA: Insufficient documentation

## 2017-01-09 DIAGNOSIS — F419 Anxiety disorder, unspecified: Secondary | ICD-10-CM | POA: Insufficient documentation

## 2017-01-09 DIAGNOSIS — E0500 Thyrotoxicosis with diffuse goiter without thyrotoxic crisis or storm: Secondary | ICD-10-CM

## 2017-01-09 DIAGNOSIS — I10 Essential (primary) hypertension: Secondary | ICD-10-CM

## 2017-01-09 DIAGNOSIS — Z79899 Other long term (current) drug therapy: Secondary | ICD-10-CM | POA: Insufficient documentation

## 2017-01-09 MED ORDER — METOPROLOL TARTRATE 25 MG PO TABS: 25.0000 mg | ORAL_TABLET | Freq: Two times a day (BID) | ORAL | 5 refills | Status: AC

## 2017-01-09 MED ORDER — DIAZEPAM 10 MG PO TABS: 10.0000 mg | ORAL_TABLET | Freq: Two times a day (BID) | ORAL | 5 refills | Status: AC | PRN

## 2017-01-09 NOTE — Progress Notes (Signed)
Subjective:  Patient ID: Donna Ray, female    DOB: [birth year]  Age: 46 y.o. MRN: [id number]  CC: Hyperthyroidism   HPI Donna Ray is uninsured with hyperthyroidism she presents for   1. Graves disease hyperthyroidism diagnosed in 03/2014: History: Diagnosed 04/24/2014  TSH < 0.005 TSI of 618 04/25/2014  Thyroid ultrasound 04/25/2014: IMPRESSION: 1. Enlarged hyperemic thyroid without nodule or other discrete lesion.  Currently, she is taking methimazole 10 mg TID. She was out of methimazole last week for one week. She noticed eye redness, soreness and puffiness, she also noticed return of slight tremor. She feels fatigue. She wakes up in the middle of the night. Taking valium helps. She had some chest pain during the week of no methimazole. No chest pain now.   She still has muscle aches in thighs and back. No CP, palpitations, shortness of breath.   Social History  Substance Use Topics  . Smoking status: Never Smoker  . Smokeless tobacco: Never Used  . Alcohol use 0.5 oz/week    1 Standard drinks or equivalent per week     Comment: occasionally    Outpatient Medications Prior to Visit  Medication Sig Dispense Refill  . aspirin EC 81 MG EC tablet Take 1 tablet (81 mg total) by mouth daily. 30 tablet 0  . BIOTIN PO Take 1 tablet by mouth daily.    . diazepam (VALIUM) 10 MG tablet Take 1 tablet (10 mg total) by mouth every 12 (twelve) hours as needed for anxiety. 60 tablet 2  . methimazole (TAPAZOLE) 10 MG tablet Take 1 tablet (10 mg total) by mouth 3 (three) times daily. 90 tablet 2  . metoprolol tartrate (LOPRESSOR) 25 MG tablet TAKE 1 TABLET BY MOUTH TWICE DAILY. 60 tablet 2  . metroNIDAZOLE (FLAGYL) 500 MG tablet Take 1 tablet (500 mg total) by mouth 2 (two) times daily. (Patient not taking: Reported on 01/09/2017) 14 tablet 0  . naproxen sodium (ANAPROX) 220 MG tablet Take 220-880 mg by mouth 2 (two) times daily as needed (pain).     No facility-administered medications  prior to visit.     ROS Review of Systems  Constitutional: Negative for chills and fever.  Eyes: Negative for visual disturbance.  Respiratory: Negative for shortness of breath.   Cardiovascular: Negative for chest pain.  Gastrointestinal: Negative for abdominal pain and blood in stool.  Musculoskeletal: Negative for arthralgias and back pain.  Skin: Negative for rash.  Allergic/Immunologic: Negative for immunocompromised state.  Hematological: Negative for adenopathy. Does not bruise/bleed easily.  Psychiatric/Behavioral: Negative for dysphoric mood and suicidal ideas.    Objective:  BP (!) 147/84 (BP Location: Left Arm, Patient Position: Sitting, Cuff Size: Small)   Pulse 96   Temp 98.2 F (36.8 C) (Oral)   Resp 16   Wt 129 lb 3.2 oz (58.6 kg)   SpO2 100%   BMI 21.50 kg/m   BP/Weight 01/09/2017 09/18/2016 07/07/2016  Systolic BP 147 132 136  Diastolic BP 84 77 64  Wt. (Lbs) 129.2 128.6 131  BMI 21.5 21.4 21.8    Physical Exam  Constitutional: She appears well-developed and well-nourished. No distress.  Eyes: EOM are normal.    Neck: Thyromegaly present.    Cardiovascular: Normal rate, regular rhythm, normal heart sounds and intact distal pulses.   Pulmonary/Chest: Effort normal and breath sounds normal.  Genitourinary: Uterus normal. Pelvic exam was performed with patient prone. Cervix exhibits no motion tenderness, no discharge and no friability.  Musculoskeletal: She exhibits no edema.  Skin: Skin is warm and dry. No rash noted.   Lab Results  Component Value Date   TSH 0.01 (L) 12/04/2016   Lab Results  Component Value Date   TSH 0.01 (L) 12/04/2016   T3TOTAL 225.0 (H) 09/18/2016     Assessment & Plan:  Donna Ray was seen today for hyperthyroidism.  Diagnoses and all orders for this visit:  Graves' disease -     Cancel: TSH -     Cancel: T3, Free -     Cancel: T4, free; Future -     Ambulatory referral to Ophthalmology -     Ambulatory referral to  Endocrinology -     diazepam (VALIUM) 10 MG tablet; Take 1 tablet (10 mg total) by mouth every 12 (twelve) hours as needed for anxiety. -     TSH; Future -     T3, Free; Future -     T4, free; Future -     Thyroid Stimulating Immunoglobulin; Future  Essential hypertension -     metoprolol tartrate (LOPRESSOR) 25 MG tablet; Take 1 tablet (25 mg total) by mouth 2 (two) times daily.  Graves disease   There are no diagnoses linked to this encounter.  No orders of the defined types were placed in this encounter.   Follow-up: No Follow-up on file.   Dessa PhiJosalyn Sande Pickert MD

## 2017-01-09 NOTE — Patient Instructions (Addendum)
Donna Ray was seen today for hyperthyroidism.  Diagnoses and all orders for this visit:  Hyperthyroidism -     Cancel: TSH -     Cancel: T3, Free -     Cancel: T4, free; Future -     Ambulatory referral to Ophthalmology -     Ambulatory referral to Endocrinology -     diazepam (VALIUM) 10 MG tablet; Take 1 tablet (10 mg total) by mouth every 12 (twelve) hours as needed for anxiety. -     TSH; Future -     T3, Free; Future -     T4, free; Future  Essential hypertension -     metoprolol tartrate (LOPRESSOR) 25 MG tablet; Take 1 tablet (25 mg total) by mouth 2 (two) times daily.   I have placed a referral to endocrinology and opthalmology  F/u in 4 weeks for labs, thyroid function test  See me in the office in 3 months  Dr. Armen PickupFunches

## 2017-01-13 NOTE — Assessment & Plan Note (Signed)
Graves disease with evidence of hyperthyroidism following one week without medication Plan: Continue methimazole 10 mg ITD Referrals to Endocrinology and Opthalmology

## 2017-03-02 MED FILL — methIMAzole 10 MG TABS: 10 | 30 days supply | Qty: 90 | Fill #1 | Status: TO

## 2017-03-03 MED FILL — METOPROLOL TARTRATE 25 MG T: 25 | 30 days supply | Qty: 60 | Fill #0

## 2017-05-13 ENCOUNTER — Encounter: Payer: Self-pay | Admitting: Family Medicine

## 2017-10-22 MED FILL — METOPROLOL TARTRATE 25 MG T: 25 | 30 days supply | Qty: 60 | Fill #1
